# Patient Record
Sex: Female | Born: 1960
Health system: Southern US, Community
[De-identification: ages and names within clinical notes are randomized; demographics above are authoritative.]

## PROBLEM LIST (undated history)

## (undated) DIAGNOSIS — E039 Hypothyroidism, unspecified: Secondary | ICD-10-CM

## (undated) DIAGNOSIS — Z9889 Other specified postprocedural states: Secondary | ICD-10-CM

## (undated) DIAGNOSIS — Z972 Presence of dental prosthetic device (complete) (partial): Secondary | ICD-10-CM

## (undated) DIAGNOSIS — I10 Essential (primary) hypertension: Secondary | ICD-10-CM

## (undated) DIAGNOSIS — M1990 Unspecified osteoarthritis, unspecified site: Secondary | ICD-10-CM

## (undated) DIAGNOSIS — R7303 Prediabetes: Secondary | ICD-10-CM

## (undated) DIAGNOSIS — K219 Gastro-esophageal reflux disease without esophagitis: Secondary | ICD-10-CM

## (undated) HISTORY — PX: CERVICAL CONE BIOPSY: SUR198

## (undated) HISTORY — PX: ABDOMINAL HYSTERECTOMY: SHX81

---

## 2012-03-04 LAB — HIV ANTIBODY (ROUTINE TESTING W REFLEX): HIV 1&2 Ab, 4th Generation: NEGATIVE

## 2012-03-04 LAB — HM HEPATITIS C SCREENING LAB: HM Hepatitis Screen: NEGATIVE

## 2013-06-09 ENCOUNTER — Emergency Department: Payer: Self-pay | Admitting: Emergency Medicine
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
## 2016-04-14 ENCOUNTER — Ambulatory Visit: Payer: Self-pay | Admitting: Allergy and Immunology

## 2016-04-15 ENCOUNTER — Telehealth: Payer: Self-pay | Admitting: *Deleted

## 2016-04-15 NOTE — Telephone Encounter (Signed)
Called pt.  No answer.  Left VM to call office to give update.  Pt did not show for appt with Dr. Nunzio CobbsBobbitt on 04/14/16 at 10:30 after being worked in that morning for appointment.   Pt was contacted on 04/14/16 by Truitt MerlePatsy Murray and VM message left to call office.

## 2019-05-24 ENCOUNTER — Other Ambulatory Visit: Payer: Self-pay | Admitting: Family Medicine

## 2019-05-24 DIAGNOSIS — Z1231 Encounter for screening mammogram for malignant neoplasm of breast: Secondary | ICD-10-CM

## 2019-06-22 ENCOUNTER — Other Ambulatory Visit: Payer: Self-pay | Admitting: Family Medicine

## 2019-06-22 DIAGNOSIS — H8110 Benign paroxysmal vertigo, unspecified ear: Secondary | ICD-10-CM

## 2019-06-30 ENCOUNTER — Other Ambulatory Visit: Payer: Self-pay

## 2019-06-30 ENCOUNTER — Ambulatory Visit
Admission: RE | Admit: 2019-06-30 | Discharge: 2019-06-30 | Disposition: A | Discharge status: Home / Self Care | Payer: 59 | Source: Ambulatory Visit | Attending: Family Medicine | Admitting: Family Medicine

## 2019-06-30 DIAGNOSIS — Z1231 Encounter for screening mammogram for malignant neoplasm of breast: Secondary | ICD-10-CM

## 2019-06-30 IMAGING — MG DIGITAL SCREENING BILATERAL MAMMOGRAM WITH TOMO AND CAD
8 series · 9 of 24 positions shown · non-contrast
Comparison: None.

CLINICAL DATA: Screening.

EXAM:
DIGITAL SCREENING BILATERAL MAMMOGRAM WITH TOMO AND CAD

[L CC synth-2D]
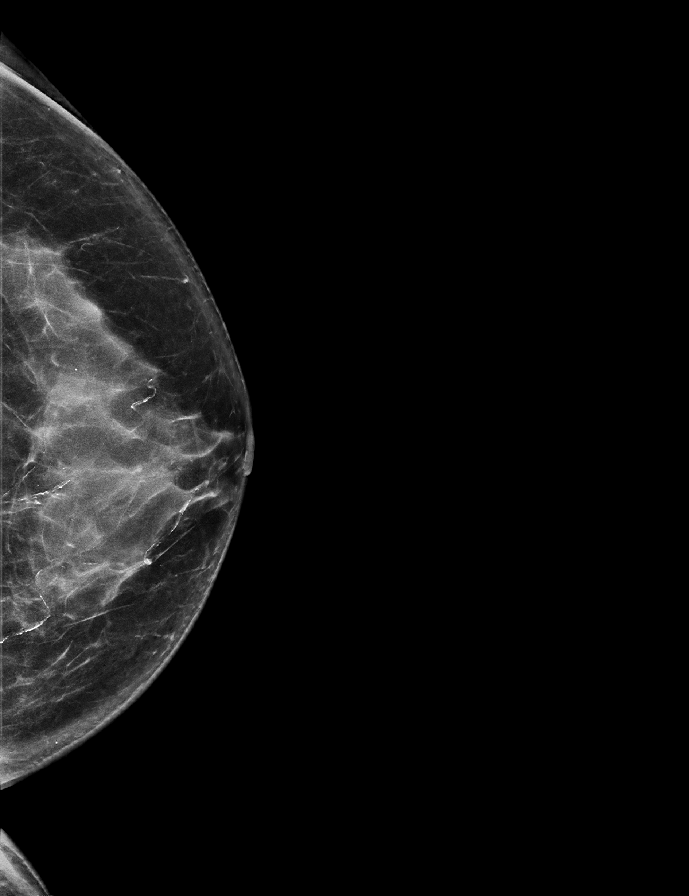

[R MLO synth-2D]
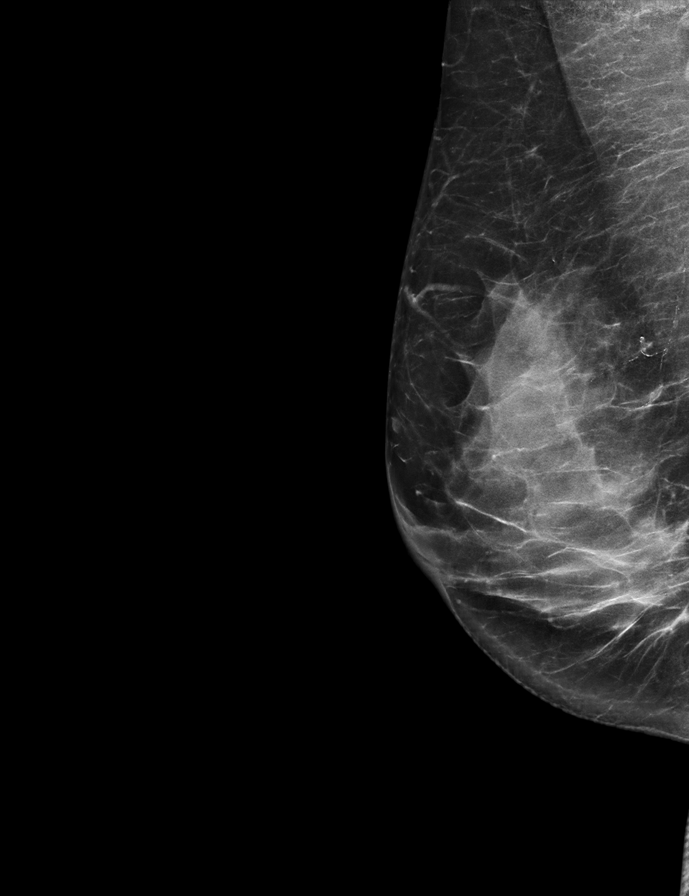

[L MLO synth-2D]
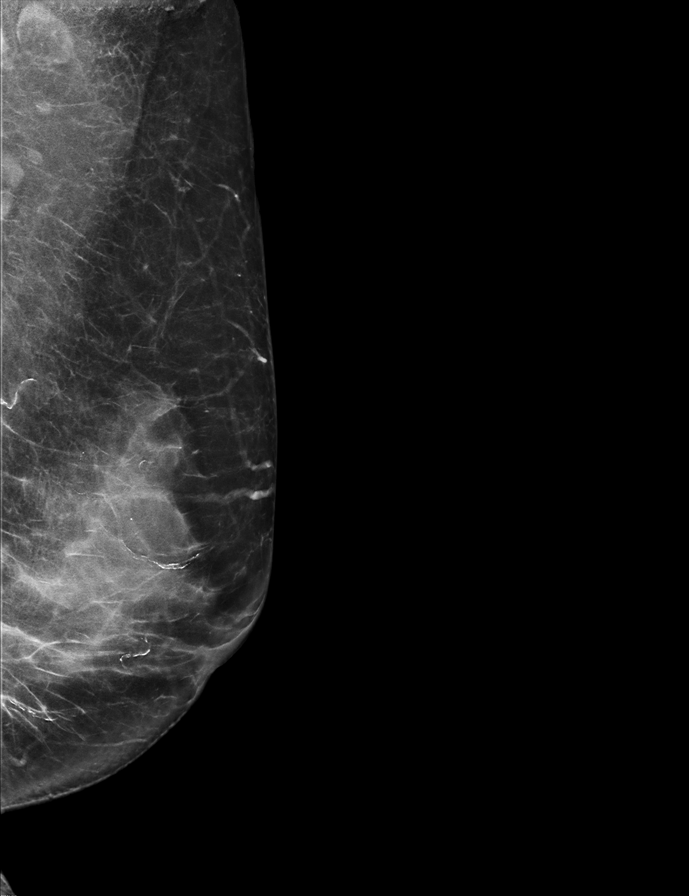

[R CC synth-2D]
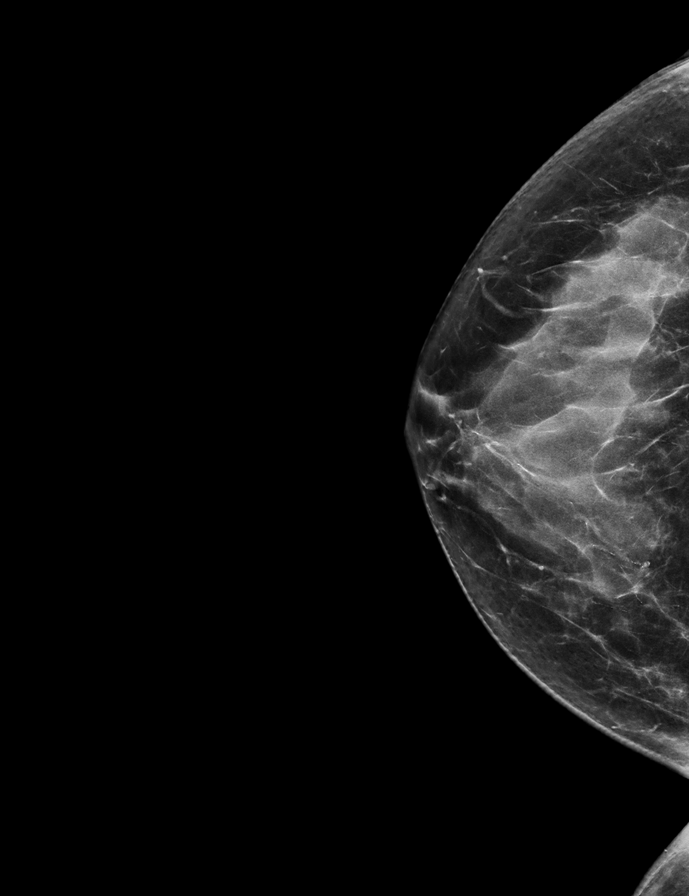

[L MLO tomo · 2 of 73 frames shown]
[frame 24/73]
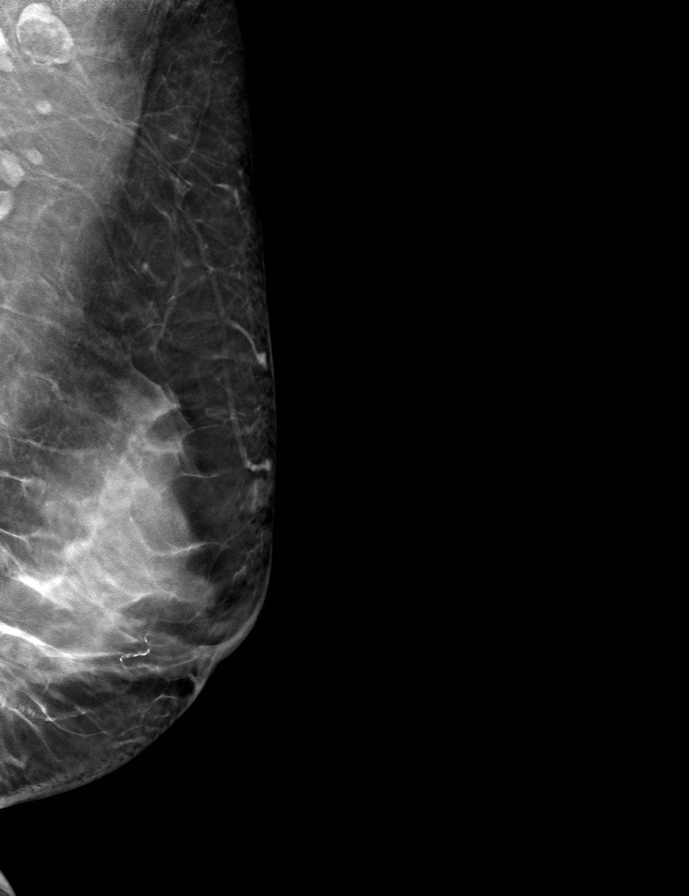
[frame 37/73]
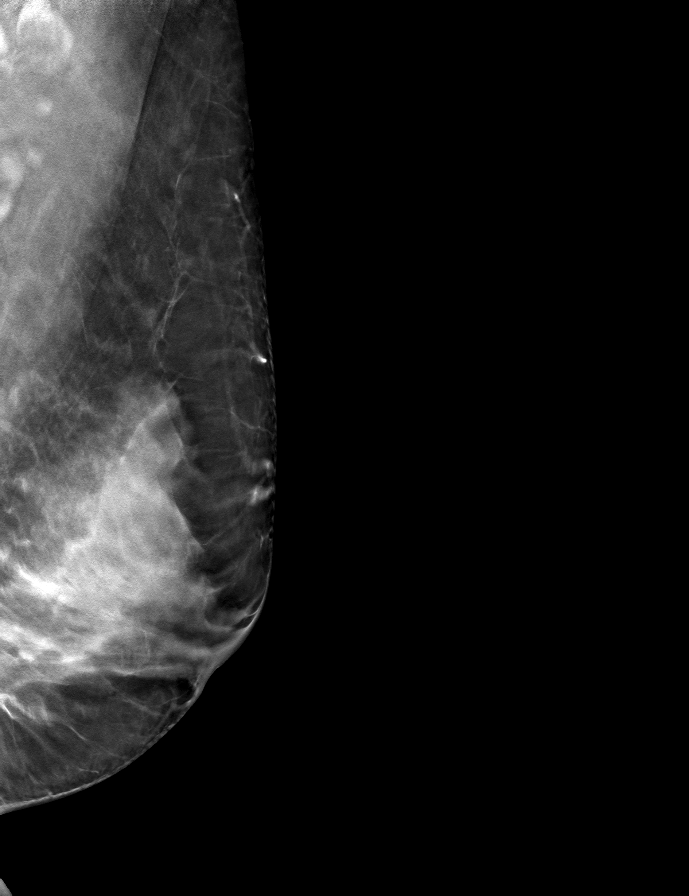

[R MLO tomo · tomo slice 37/72.0]
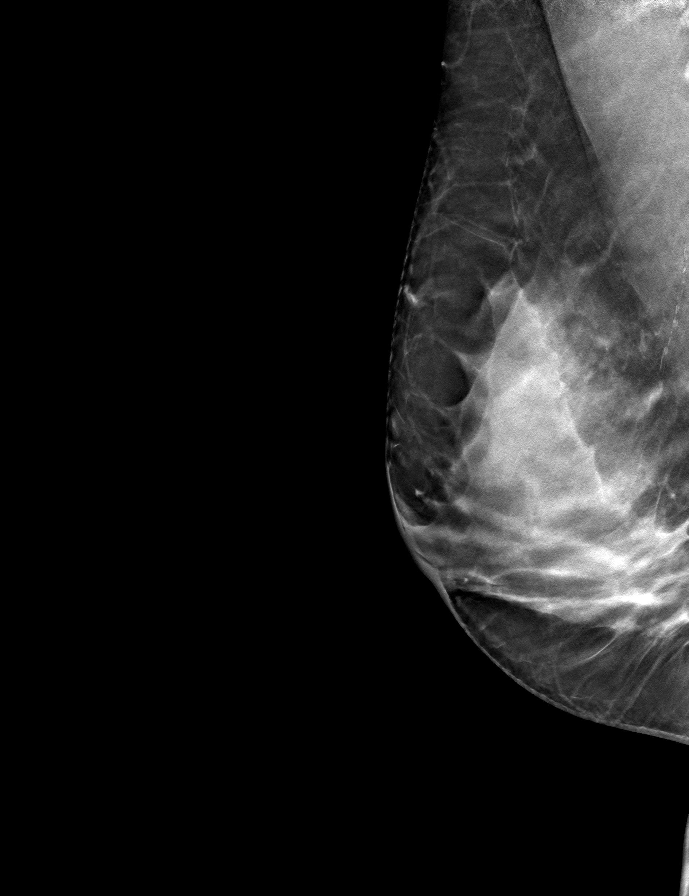

[L CC tomo · tomo slice 35/70.0]
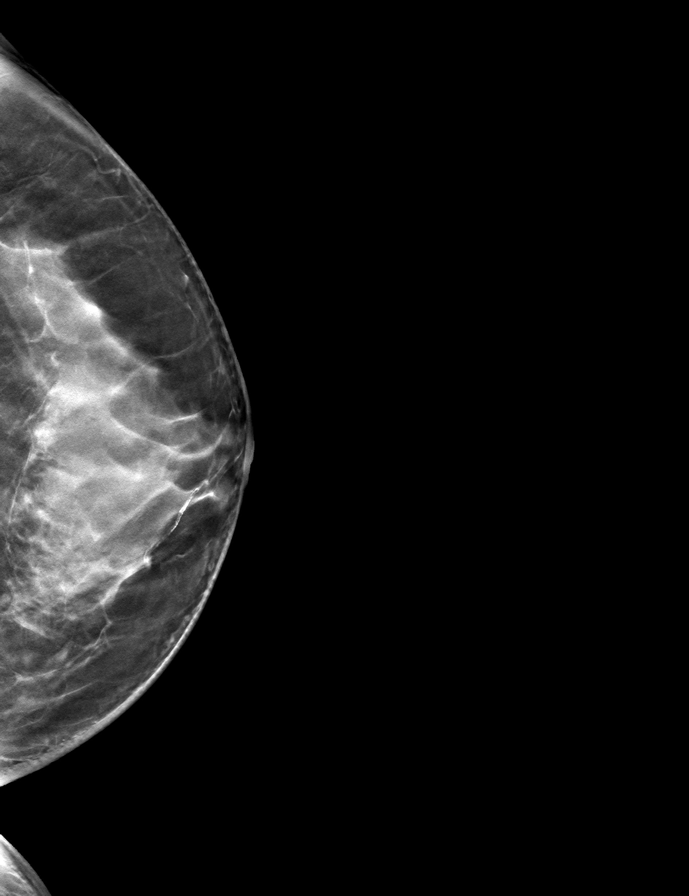

[R CC tomo · tomo slice 35/68.0]
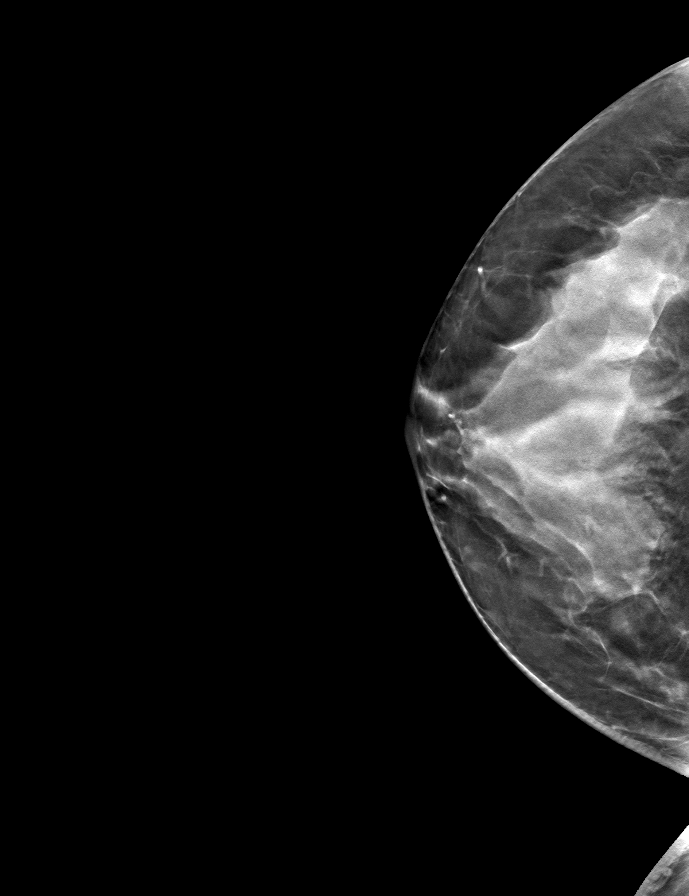

[9 of 24 positions shown; findings below may reference images not displayed]

ACR Breast Density Category c: The breast tissue is heterogeneously
dense, which may obscure small masses
FINDINGS: There are no findings suspicious for malignancy. Images were
processed with CAD.
IMPRESSION: No mammographic evidence of malignancy. A result letter of this
screening mammogram will be mailed directly to the patient.

RECOMMENDATION:
Screening mammogram in one year. (Code:[4W])

BI-RADS CATEGORY  1: Negative.

## 2019-07-07 ENCOUNTER — Ambulatory Visit: Payer: Self-pay

## 2019-08-03 ENCOUNTER — Other Ambulatory Visit: Payer: Self-pay | Admitting: Internal Medicine

## 2019-08-03 DIAGNOSIS — Z20822 Contact with and (suspected) exposure to covid-19: Secondary | ICD-10-CM

## 2019-08-04 ENCOUNTER — Ambulatory Visit: Payer: 59

## 2019-08-04 LAB — NOVEL CORONAVIRUS, NAA: SARS-CoV-2, NAA: NOT DETECTED

## 2019-08-13 ENCOUNTER — Ambulatory Visit
Admission: RE | Admit: 2019-08-13 | Discharge: 2019-08-13 | Disposition: A | Discharge status: Home / Self Care | Payer: 59 | Source: Ambulatory Visit | Attending: Family Medicine | Admitting: Family Medicine

## 2019-08-13 DIAGNOSIS — H8110 Benign paroxysmal vertigo, unspecified ear: Secondary | ICD-10-CM | POA: Diagnosis present

## 2019-08-13 IMAGING — MR MR HEAD W/O CM
12 series · 46 of 48 positions shown · non-contrast
Comparison: None.

CLINICAL DATA: Benign paroxysmal positional vertigo. Headaches
radiating into the neck since childhood.

EXAM:
MRI HEAD WITHOUT CONTRAST
TECHNIQUE: Multiplanar, multiecho pulse sequences of the brain and surrounding
structures were obtained without intravenous contrast.

[Series 5: ax dwi_tracew · axial · 3.0mm · 0.60mm/px · z∈[-107,+42]mm · 3 of 46 slices shown]
[im 1/46]
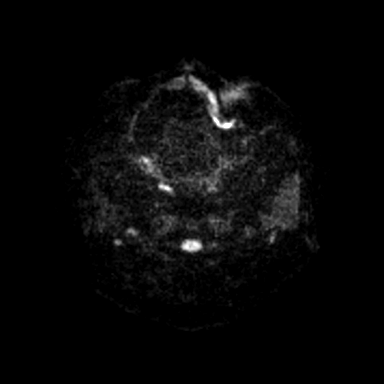
[im 23/46]
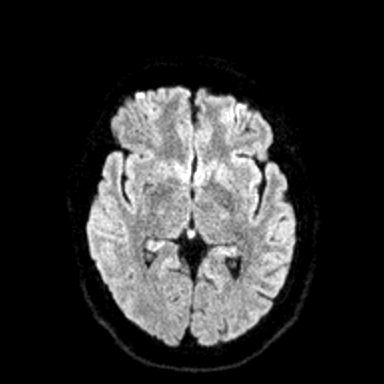
[im 46/46]
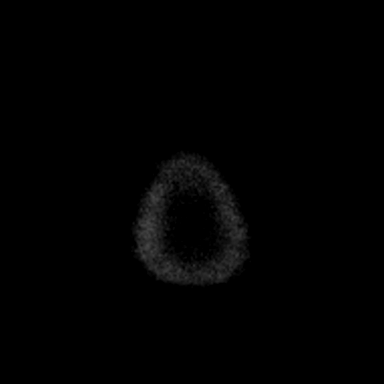

[Series 6: ax dwi_adc · axial · 3.0mm · 0.60mm/px · z∈[-107,+42]mm · 3 of 46 slices shown]
[im 1/46]
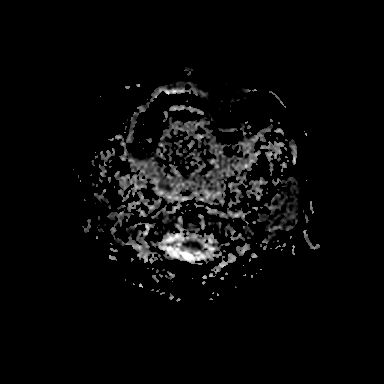
[im 23/46]
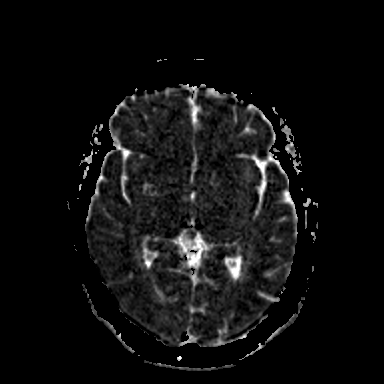
[im 46/46]
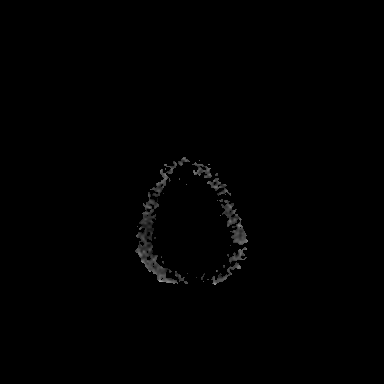

[Series 7: cor dwi_tracew · coronal · 5.0mm · 0.60mm/px · 3 of 34 slices shown]
[im 1/34]
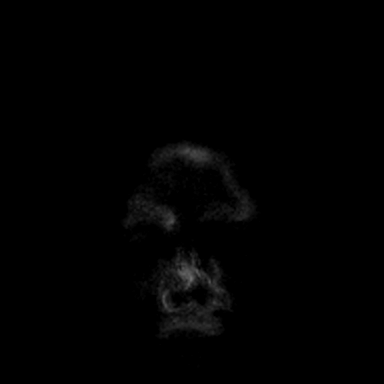
[im 17/34]
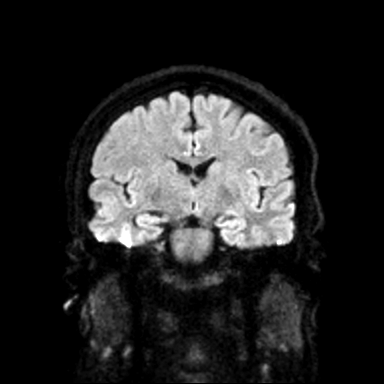
[im 34/34]
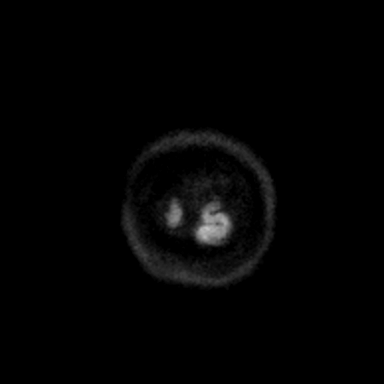

[Series 8: cor dwi_adc · coronal · 5.0mm · 0.60mm/px · 3 of 34 slices shown]
[im 1/34]
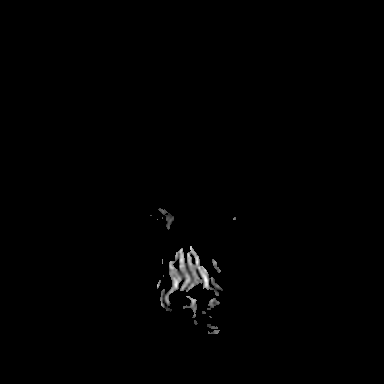
[im 17/34]
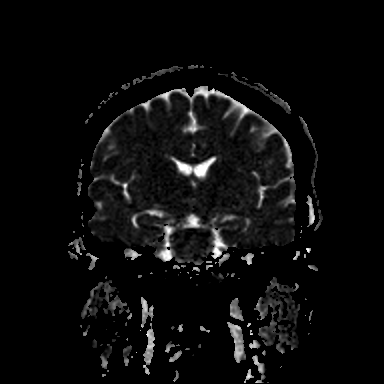
[im 34/34]
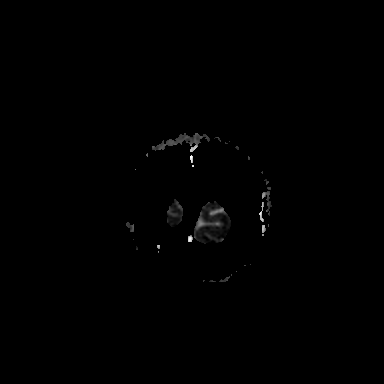

[Series 9: T1 · sagittal · 5.0mm · 0.62mm/px · 2 of 21 slices shown (1 of 2)]
[im 1/21]
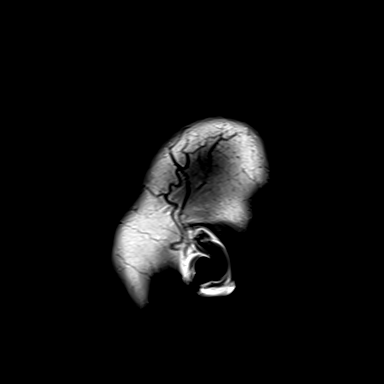
[im 21/21]
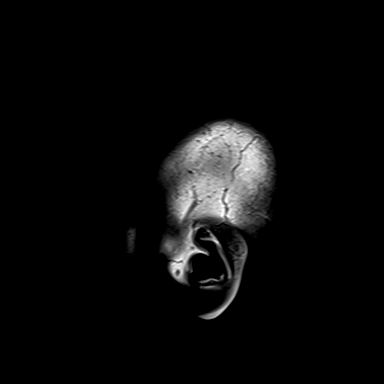

[Series 10: T2 · axial · 5.0mm · 0.53mm/px · z∈[-101,+42]mm · 2 of 25 slices shown (1 of 2)]
[im 1/25]
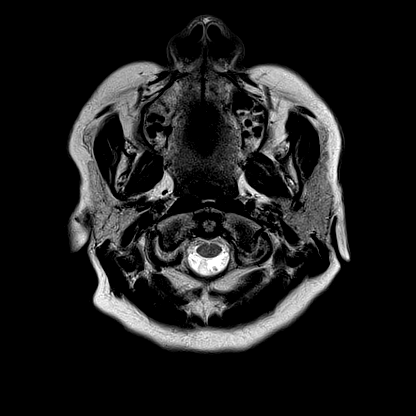
[im 25/25]
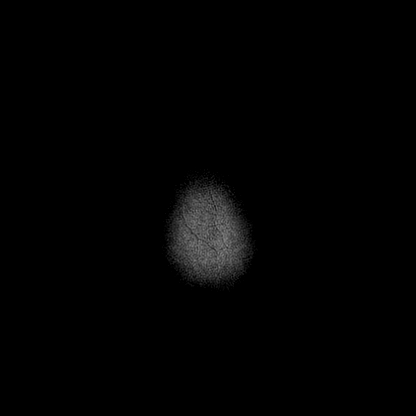

[Series 11: mag_images · axial · 3.0mm · 0.90mm/px · z∈[-118,+59]mm · 5 of 60 slices shown]
[im 1/60]
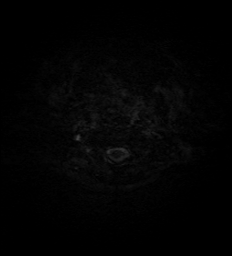
[im 15/60]
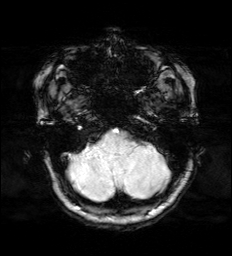
[im 30/60]
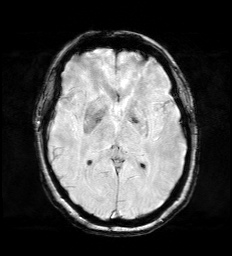
[im 45/60]
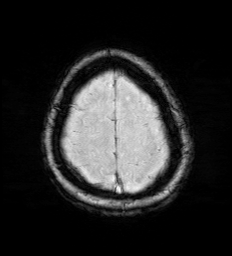
[im 60/60]
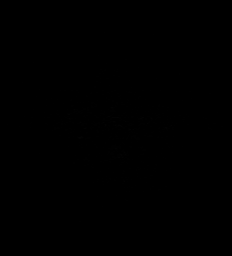

[Series 12: pha_images · axial · 3.0mm · 0.90mm/px · z∈[-118,+56]mm · 5 of 58 slices shown]
[im 1/58]
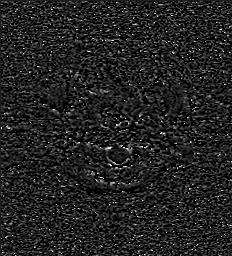
[im 15/58]
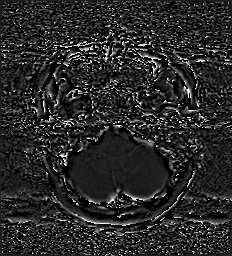
[im 29/58]
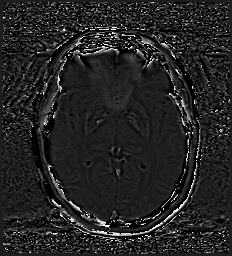
[im 43/58]
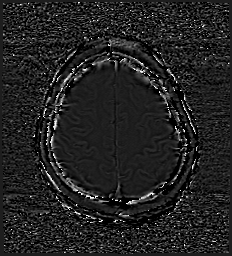
[im 58/58]
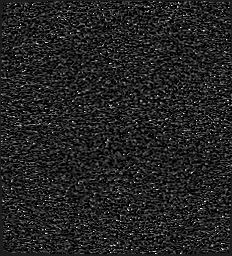

[Series 13: swi_images · axial · 3.0mm · 0.90mm/px · z∈[-118,+59]mm · 5 of 60 slices shown]
[im 1/60]
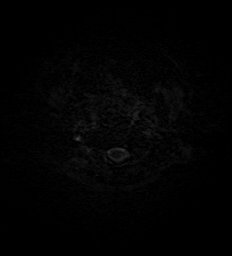
[im 15/60]
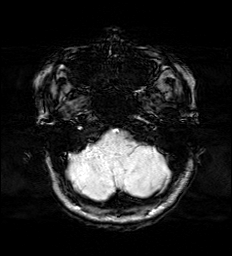
[im 30/60]
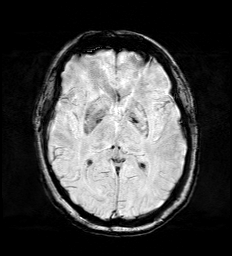
[im 45/60]
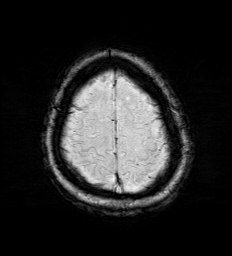
[im 60/60]
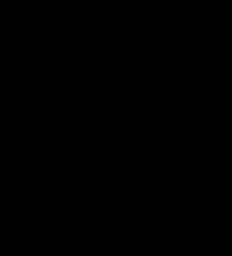

[Series 15: FLAIR · axial · 3.0mm · 0.53mm/px · z∈[-110,+51]mm · 4 of 55 slices shown]
[im 1/55]
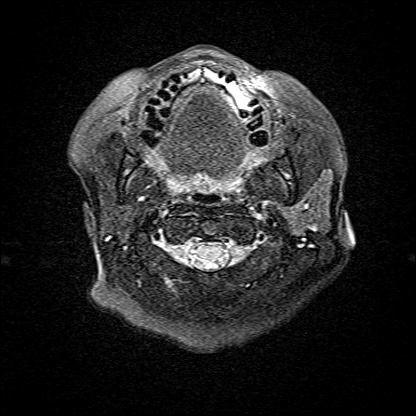
[im 19/55]
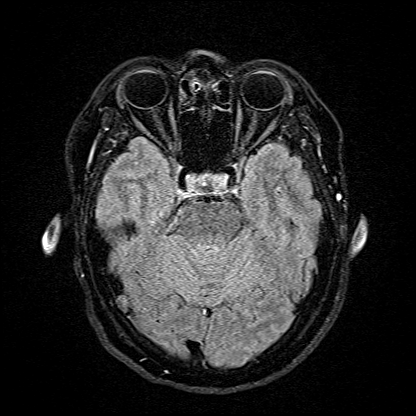
[im 37/55]
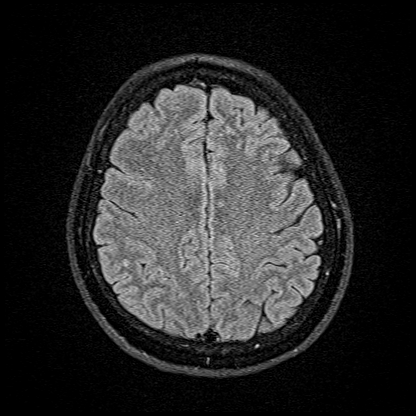
[im 55/55]
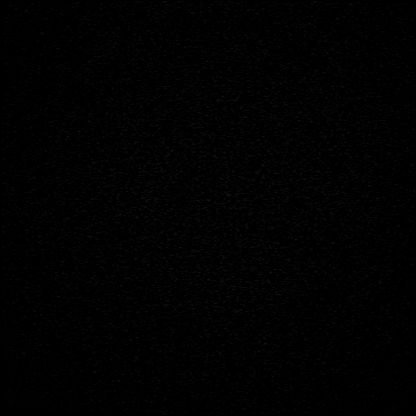

[Series 16: T1 · axial · 1.0mm · 0.98mm/px · z∈[-101,+42]mm · 9 of 144 slices shown (2 of 2)]
[im 1/144]
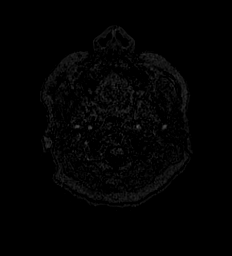
[im 15/144]
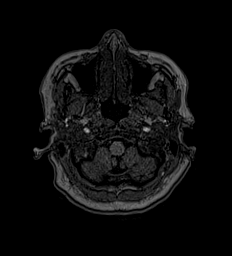
[im 29/144]
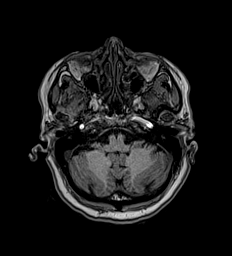
[im 43/144]
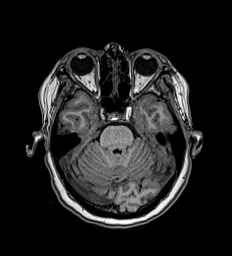
[im 58/144]
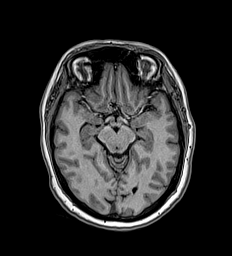
[im 86/144]
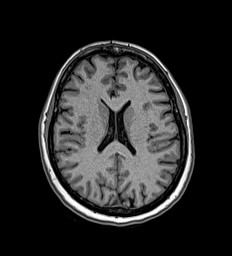
[im 101/144]
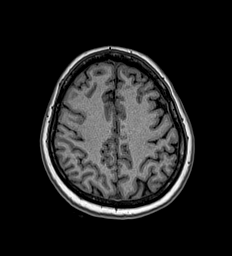
[im 115/144]
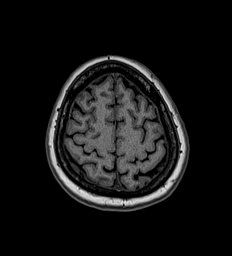
[im 144/144]
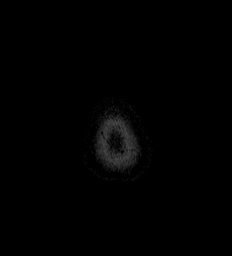

[Series 17: T2 · coronal · 5.0mm · 0.57mm/px · 2 of 29 slices shown (2 of 2)]
[im 1/29]
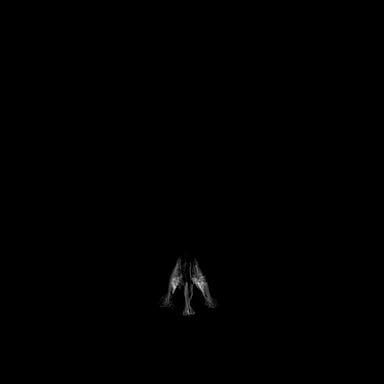
[im 29/29]
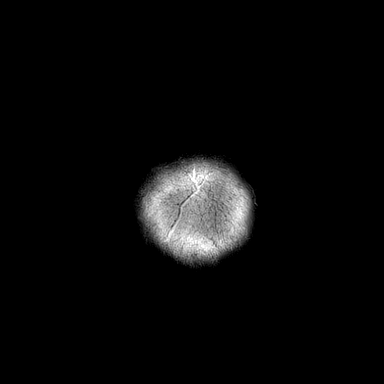

[46 of 48 positions shown; findings below may reference images not displayed]

FINDINGS: Brain: No infarction, hemorrhage, hydrocephalus, extra-axial
collection or mass lesion. No Chiari malformation. Normal brain
volume. Few small FLAIR hyperintensities in the cerebral white
matter (less than 20) with nonspecific pattern. No typical multiple
sclerosis pattern of infratentorial and periventricular involvement.
These could be related to prior ischemia, inflammation, complicated
migraine, or other vasculopathy.

Vascular: Normal flow voids.

Skull and upper cervical spine: Normal marrow signal

Sinuses/Orbits: Negative
IMPRESSION: 1. No abnormality noted in the brainstem, cisterns, or temporal
bones.
2. Mild remote white matter insult which is nonspecific.

## 2019-11-18 ENCOUNTER — Other Ambulatory Visit: Payer: Self-pay

## 2019-11-18 DIAGNOSIS — Z20822 Contact with and (suspected) exposure to covid-19: Secondary | ICD-10-CM

## 2019-11-20 LAB — NOVEL CORONAVIRUS, NAA: SARS-CoV-2, NAA: NOT DETECTED

## 2021-05-21 ENCOUNTER — Other Ambulatory Visit: Payer: Self-pay | Admitting: Family Medicine

## 2021-05-21 DIAGNOSIS — M5412 Radiculopathy, cervical region: Secondary | ICD-10-CM

## 2021-06-03 ENCOUNTER — Ambulatory Visit
Admission: RE | Admit: 2021-06-03 | Discharge: 2021-06-03 | Disposition: A | Discharge status: Home / Self Care | Payer: 59 | Source: Ambulatory Visit | Attending: Family Medicine | Admitting: Family Medicine

## 2021-06-03 ENCOUNTER — Other Ambulatory Visit: Payer: Self-pay

## 2021-06-03 DIAGNOSIS — M5412 Radiculopathy, cervical region: Secondary | ICD-10-CM

## 2021-06-03 IMAGING — MR MR CERVICAL SPINE W/O CM
5 series · 35 of 48 positions shown · non-contrast
Comparison: CT of the cervical spine [DATE]

CLINICAL DATA: Cervical radiculitis. Additional history provided by
scanning technologist: Patient reports 8 months of posterior neck
pain that radiates into both shoulders. Numbness.

EXAM:
MRI CERVICAL SPINE WITHOUT CONTRAST
TECHNIQUE: Multiplanar, multisequence MR imaging of the cervical spine was
performed. No intravenous contrast was administered.

[Series 2: T2 · sagittal · 3.0mm · 0.41mm/px · 8 of 15 slices shown (1 of 2)]
[im 1/15]
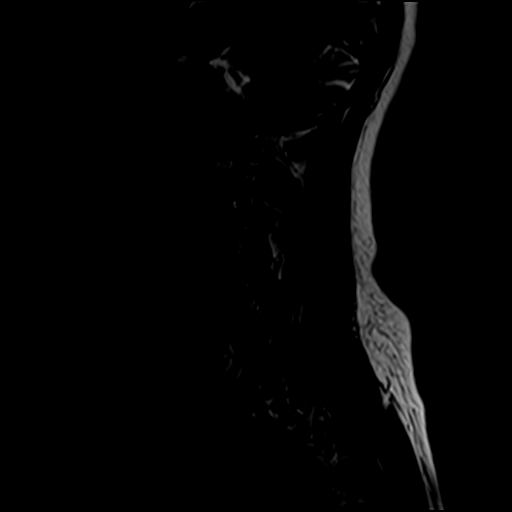
[im 3/15]
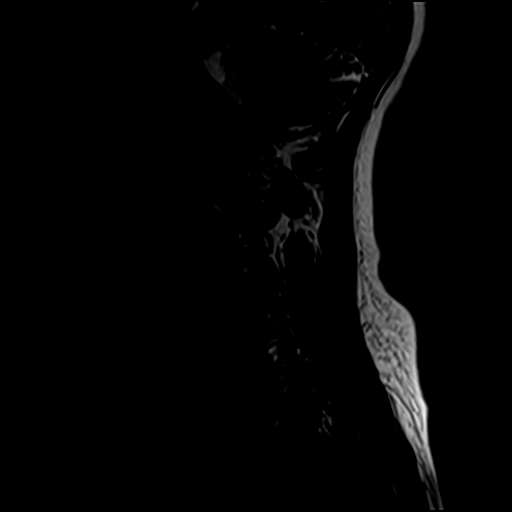
[im 5/15]
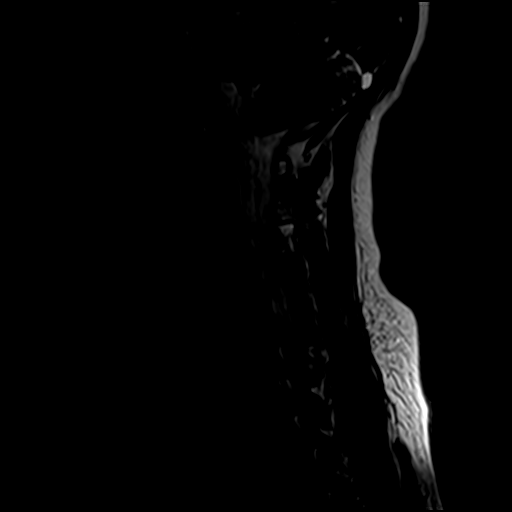
[im 7/15]
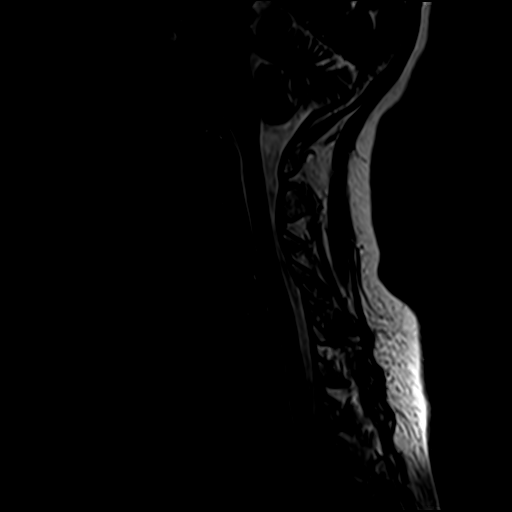
[im 9/15]
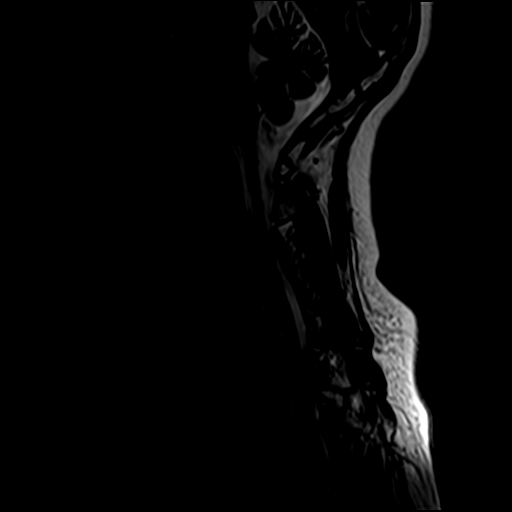
[im 11/15]
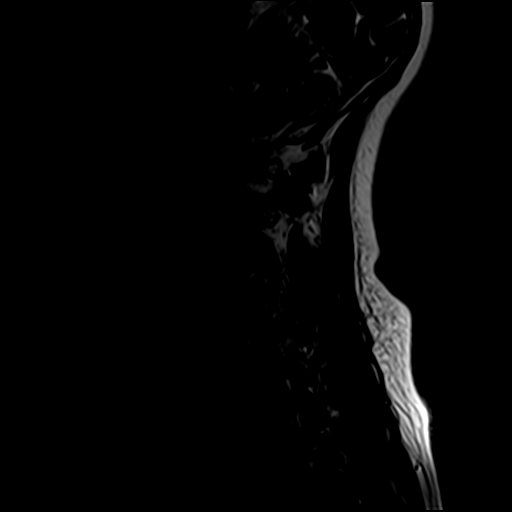
[im 13/15]
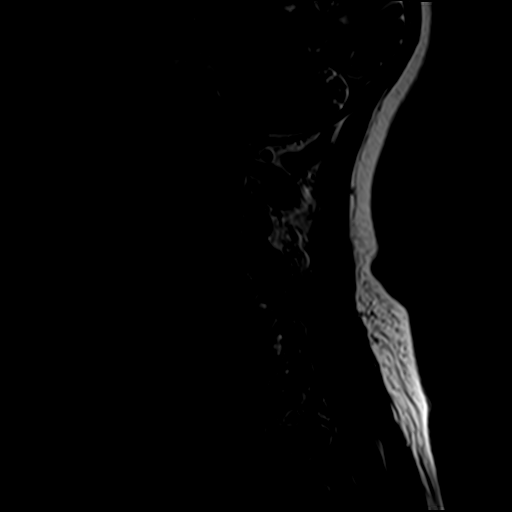
[im 15/15]
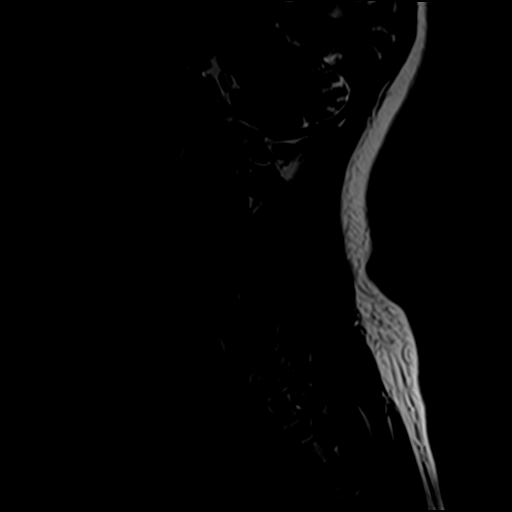

[Series 3: STIR · sagittal · 3.0mm · 0.82mm/px · 8 of 15 slices shown]
[im 1/15]
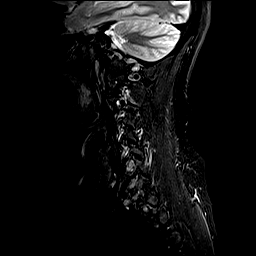
[im 3/15]
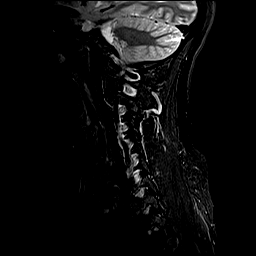
[im 5/15]
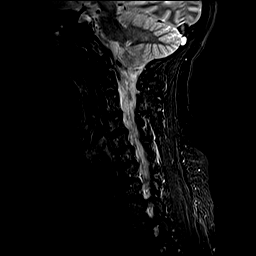
[im 7/15]
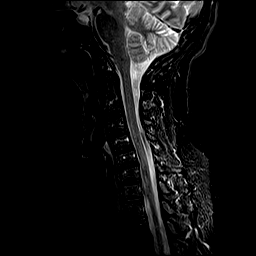
[im 9/15]
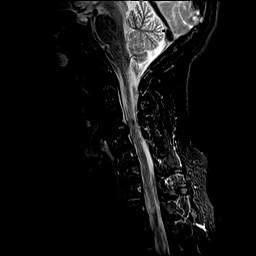
[im 11/15]
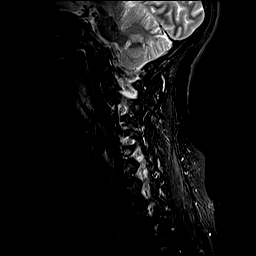
[im 13/15]
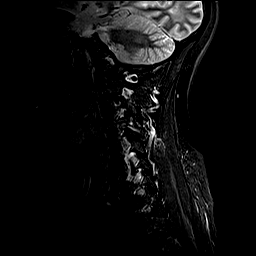
[im 15/15]
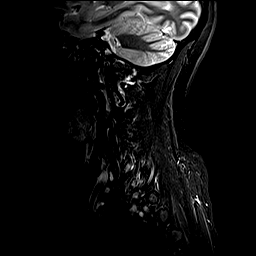

[Series 4: T1 · sagittal · 3.0mm · 0.82mm/px · 8 of 15 slices shown]
[im 1/15]
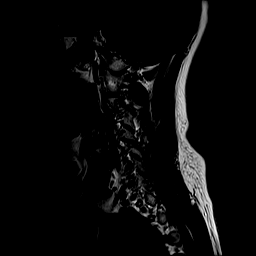
[im 3/15]
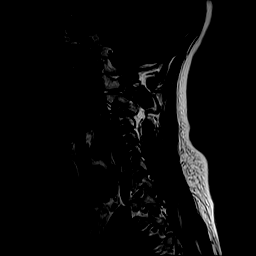
[im 5/15]
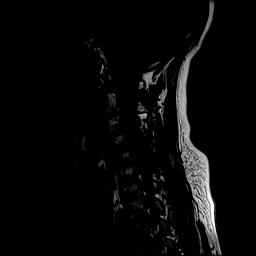
[im 7/15]
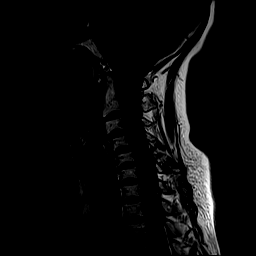
[im 9/15]
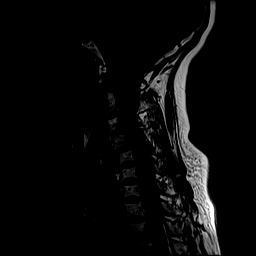
[im 11/15]
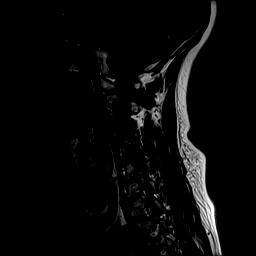
[im 13/15]
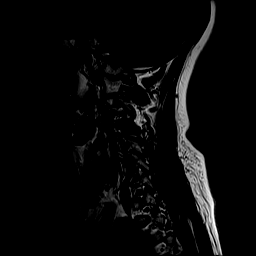
[im 15/15]
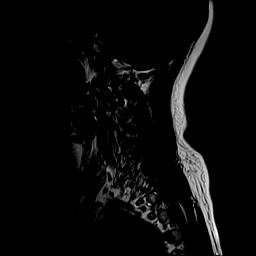

[Series 5: T2 · axial · 3.0mm · 0.70mm/px · z∈[-76,+3]mm · 9 of 23 slices shown (2 of 2)]
[im 1/23]
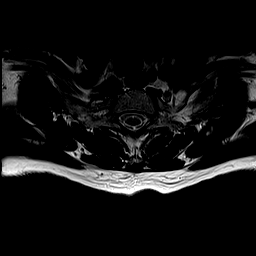
[im 5/23]
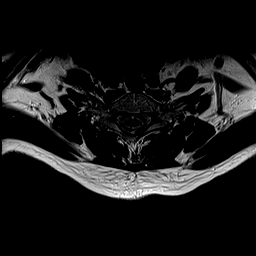
[im 7/23]
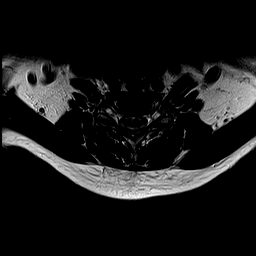
[im 11/23]
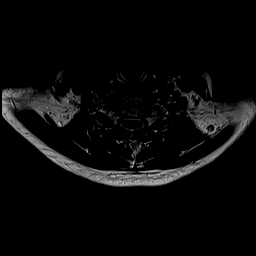
[im 13/23]
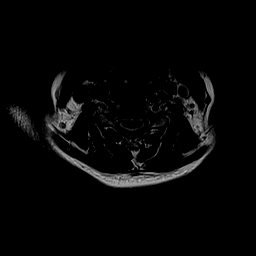
[im 17/23]
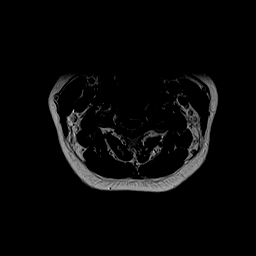
[im 19/23]
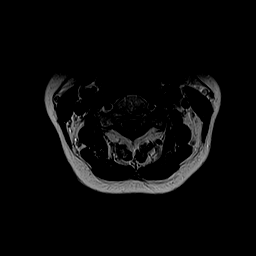
[im 21/23]
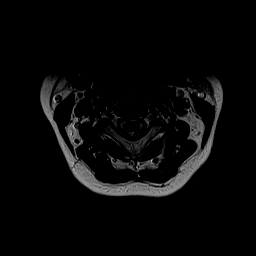
[im 23/23]
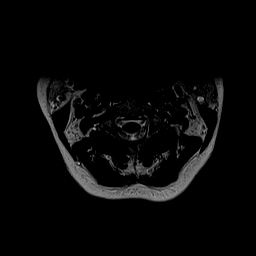

[Series 6: GRE · axial · 3.0mm · 0.35mm/px · z∈[-76,-62]mm · 2 of 23 slices shown]
[im 1/23]
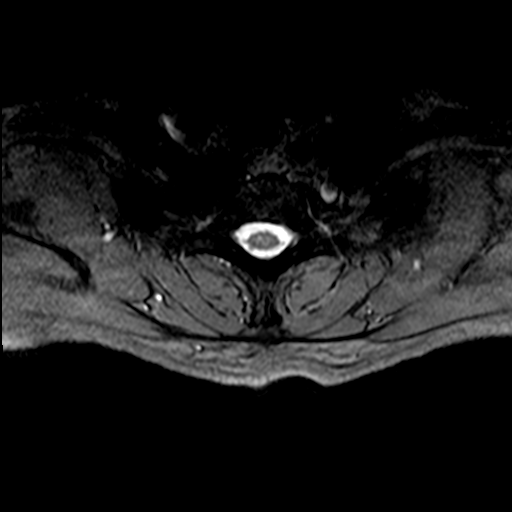
[im 5/23]
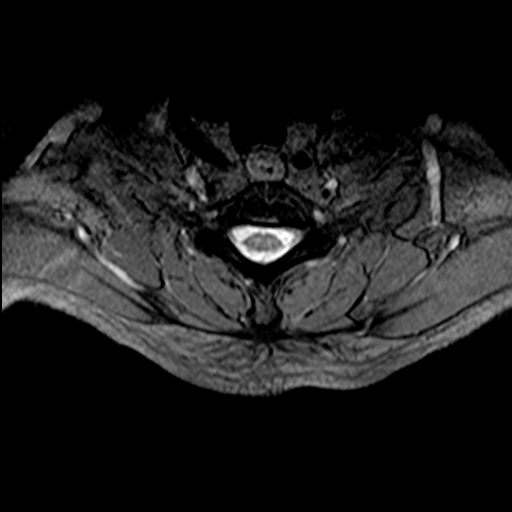

[35 of 48 positions shown; findings below may reference images not displayed]

FINDINGS: Alignment: Reversal of the expected cervical lordosis. Trace C2-C3
grade 1 retrolisthesis. 2 mm C4-C5 grade 1 anterolisthesis.

Vertebrae: Vertebral body height is maintained. Edema within the
right C3 and C4 articular pillars, likely degenerative and related
to facet arthrosis. Elsewhere, no significant marrow edema or focal
suspicious osseous lesion is identified.

Cord: No spinal cord signal abnormality is identified.

Posterior Fossa, vertebral arteries, paraspinal tissues: No
abnormality identified within included portions of the posterior
fossa. Flow voids preserved within the imaged cervical vertebral
arteries. Paraspinal soft tissues within normal limits.

Disc levels:

No more than mild disc degeneration within the cervical spine.

C2-C3: Minimal uncinate hypertrophy on the right. No significant
disc herniation, spinal canal stenosis or neural foraminal
narrowing.

C3-C4: Trace grade 1 anterolisthesis. Disc uncovering with small
disc bulge. Uncovertebral hypertrophy on the left. Facet arthrosis
on the left (advanced). No significant spinal canal stenosis.
Moderate left neural foraminal narrowing.

C4-C5: 2 mm grade 1 anterolisthesis. Disc uncovering. Mild facet
arthrosis (predominantly on the left). No significant spinal canal
stenosis. Minimal relative left neural foraminal narrowing.

C5-C6: No significant disc herniation or stenosis.

C6-C7: No significant disc herniation or stenosis.

C7-T1: No significant disc herniation or stenosis.
IMPRESSION: Cervical spondylosis, as outlined. No significant spinal canal
stenosis. At C3-C4, advanced facet arthrosis contributes to moderate
left neural foraminal narrowing. Minimal relative left neural
foraminal narrowing also present at C4-C5.

Marrow edema within the left C3 and C4 articular pillars, likely
degenerative and related to facet arthrosis at this site.

Reversal of the expected cervical lordosis.

Mild C3-C4 and C4-C5 grade 1 anterolisthesis.

## 2021-06-07 ENCOUNTER — Other Ambulatory Visit: Payer: Self-pay | Admitting: Family Medicine

## 2021-06-07 DIAGNOSIS — Z1231 Encounter for screening mammogram for malignant neoplasm of breast: Secondary | ICD-10-CM

## 2021-08-07 ENCOUNTER — Other Ambulatory Visit: Payer: Self-pay

## 2021-08-07 ENCOUNTER — Ambulatory Visit
Admission: RE | Admit: 2021-08-07 | Discharge: 2021-08-07 | Disposition: A | Discharge status: Home / Self Care | Payer: 59 | Source: Ambulatory Visit | Attending: Family Medicine | Admitting: Family Medicine

## 2021-08-07 DIAGNOSIS — Z1231 Encounter for screening mammogram for malignant neoplasm of breast: Secondary | ICD-10-CM | POA: Diagnosis present

## 2022-05-30 ENCOUNTER — Emergency Department: Payer: 59

## 2022-05-30 ENCOUNTER — Encounter: Payer: Self-pay | Admitting: Emergency Medicine

## 2022-05-30 ENCOUNTER — Other Ambulatory Visit: Payer: Self-pay

## 2022-05-30 ENCOUNTER — Emergency Department
Admission: EM | Admit: 2022-05-30 | Discharge: 2022-05-30 | Disposition: A | Discharge status: Home / Self Care | Payer: 59 | Attending: Emergency Medicine | Admitting: Emergency Medicine

## 2022-05-30 DIAGNOSIS — I10 Essential (primary) hypertension: Secondary | ICD-10-CM | POA: Diagnosis not present

## 2022-05-30 DIAGNOSIS — R42 Dizziness and giddiness: Secondary | ICD-10-CM | POA: Diagnosis present

## 2022-05-30 HISTORY — DX: Essential (primary) hypertension: I10

## 2022-05-30 LAB — BASIC METABOLIC PANEL
Anion gap: 5 (ref 5–15)
BUN: 14 mg/dL (ref 6–20)
CO2: 28 mmol/L (ref 22–32)
Calcium: 9.4 mg/dL (ref 8.9–10.3)
Chloride: 107 mmol/L (ref 98–111)
Creatinine, Ser: 0.7 mg/dL (ref 0.44–1.00)
GFR, Estimated: >60 mL/min (ref >60)
Glucose, Bld: 143 mg/dL — ABNORMAL HIGH (ref 70–99)
Potassium: 3.5 mmol/L (ref 3.5–5.1)
Sodium: 140 mmol/L (ref 135–145)

## 2022-05-30 LAB — CBC
HCT: 39.5 % (ref 36.0–46.0)
Hemoglobin: 13.1 g/dL (ref 12.0–15.0)
MCH: 29.6 pg (ref 26.0–34.0)
MCHC: 33.2 g/dL (ref 30.0–36.0)
MCV: 89.4 fL (ref 80.0–100.0)
Platelets: 361 10*3/uL (ref 150–400)
RBC: 4.42 MIL/uL (ref 3.87–5.11)
RDW: 13.1 % (ref 11.5–15.5)
WBC: 8.6 10*3/uL (ref 4.0–10.5)
nRBC: 0 % (ref 0.0–0.2)

## 2022-05-30 LAB — TROPONIN I (HIGH SENSITIVITY)
Troponin I (High Sensitivity): 3 ng/L (ref <18)
Troponin I (High Sensitivity): 3 ng/L (ref <18)

## 2022-05-30 MED ORDER — AMLODIPINE BESYLATE 5 MG PO TABS: 5.0000 mg | ORAL_TABLET | Freq: Every day | ORAL | 1 refills | Status: DC

## 2022-10-15 ENCOUNTER — Encounter: Payer: Self-pay | Admitting: Ophthalmology

## 2022-10-21 NOTE — Discharge Instructions (Signed)

## 2022-10-22 ENCOUNTER — Ambulatory Visit: Payer: 59 | Admitting: Anesthesiology

## 2022-10-22 ENCOUNTER — Encounter: Payer: Self-pay | Admitting: Ophthalmology

## 2022-10-22 ENCOUNTER — Ambulatory Visit
Admission: RE | Admit: 2022-10-22 | Discharge: 2022-10-22 | Disposition: A | Discharge status: Home / Self Care | Payer: 59 | Attending: Ophthalmology | Admitting: Ophthalmology

## 2022-10-22 ENCOUNTER — Encounter
Admission: RE | Disposition: A | Discharge status: Home / Self Care | Payer: Self-pay | Source: Home / Self Care | Attending: Ophthalmology

## 2022-10-22 ENCOUNTER — Other Ambulatory Visit: Payer: Self-pay

## 2022-10-22 DIAGNOSIS — I10 Essential (primary) hypertension: Secondary | ICD-10-CM | POA: Diagnosis not present

## 2022-10-22 DIAGNOSIS — E039 Hypothyroidism, unspecified: Secondary | ICD-10-CM | POA: Insufficient documentation

## 2022-10-22 DIAGNOSIS — Z79899 Other long term (current) drug therapy: Secondary | ICD-10-CM | POA: Diagnosis not present

## 2022-10-22 DIAGNOSIS — Z79890 Hormone replacement therapy (postmenopausal): Secondary | ICD-10-CM | POA: Insufficient documentation

## 2022-10-22 DIAGNOSIS — H2511 Age-related nuclear cataract, right eye: Secondary | ICD-10-CM | POA: Insufficient documentation

## 2022-10-22 DIAGNOSIS — R7303 Prediabetes: Secondary | ICD-10-CM | POA: Insufficient documentation

## 2022-10-22 HISTORY — DX: Gastro-esophageal reflux disease without esophagitis: K21.9

## 2022-10-22 HISTORY — DX: Other specified postprocedural states: Z98.890

## 2022-10-22 HISTORY — DX: Unspecified osteoarthritis, unspecified site: M19.90

## 2022-10-22 HISTORY — DX: Hypothyroidism, unspecified: E03.9

## 2022-10-22 HISTORY — PX: CATARACT EXTRACTION W/PHACO: SHX586

## 2022-10-22 HISTORY — DX: Presence of dental prosthetic device (complete) (partial): Z97.2

## 2022-10-22 HISTORY — DX: Prediabetes: R73.03

## 2022-10-22 SURGERY — PHACOEMULSIFICATION, CATARACT, WITH IOL INSERTION
Anesthesia: Monitor Anesthesia Care | Site: Eye | Laterality: Right

## 2022-10-22 MED ORDER — SIGHTPATH DOSE#1 NA HYALUR & NA CHOND-NA HYALUR IO KIT
PACK | INTRAOCULAR | Status: DC | PRN
  Administered 2022-10-22: 1 via OPHTHALMIC

## 2022-10-22 MED ORDER — SIGHTPATH DOSE#1 BSS IO SOLN
INTRAOCULAR | Status: DC | PRN
  Administered 2022-10-22: 54 mL via OPHTHALMIC

## 2022-10-22 MED ORDER — MIDAZOLAM HCL 2 MG/2ML IJ SOLN
INTRAMUSCULAR | Status: DC | PRN
  Administered 2022-10-22: 2 mg via INTRAVENOUS

## 2022-10-22 MED ORDER — SIGHTPATH DOSE#1 BSS IO SOLN
INTRAOCULAR | Status: DC | PRN
  Administered 2022-10-22: 1 mL via INTRAMUSCULAR

## 2022-10-22 MED ORDER — FENTANYL CITRATE (PF) 100 MCG/2ML IJ SOLN
INTRAMUSCULAR | Status: DC | PRN
  Administered 2022-10-22: 100 ug via INTRAVENOUS

## 2022-10-22 MED ORDER — SIGHTPATH DOSE#1 BSS IO SOLN
INTRAOCULAR | Status: DC | PRN
  Administered 2022-10-22: 15 mL

## 2022-10-22 MED ORDER — ARMC OPHTHALMIC DILATING DROPS
1.0000 | OPHTHALMIC | Status: DC | PRN
  Administered 2022-10-22 (×3): 1 via OPHTHALMIC

## 2022-10-22 MED ORDER — TETRACAINE HCL 0.5 % OP SOLN
1.0000 [drp] | OPHTHALMIC | Status: DC | PRN
  Administered 2022-10-22 (×3): 1 [drp] via OPHTHALMIC

## 2022-10-22 MED ORDER — CEFUROXIME OPHTHALMIC INJECTION 1 MG/0.1 ML
INJECTION | OPHTHALMIC | Status: DC | PRN
  Administered 2022-10-22: .1 mL via INTRACAMERAL

## 2022-10-22 MED ORDER — BRIMONIDINE TARTRATE-TIMOLOL 0.2-0.5 % OP SOLN
OPHTHALMIC | Status: DC | PRN
  Administered 2022-10-22: 1 [drp] via OPHTHALMIC

## 2022-10-22 SURGICAL SUPPLY — 10 items
CATARACT SUITE SIGHTPATH (MISCELLANEOUS) ×1 IMPLANT
FEE CATARACT SUITE SIGHTPATH (MISCELLANEOUS) ×1 IMPLANT
GLOVE SRG 8 PF TXTR STRL LF DI (GLOVE) ×1 IMPLANT
GLOVE SURG ENC TEXT LTX SZ7.5 (GLOVE) ×1 IMPLANT
GLOVE SURG UNDER POLY LF SZ8 (GLOVE) ×1
LENS IOL TECNIS EYHANCE 25.0 (Intraocular Lens) IMPLANT
NDL FILTER BLUNT 18X1 1/2 (NEEDLE) ×1 IMPLANT
NEEDLE FILTER BLUNT 18X1 1/2 (NEEDLE) ×1 IMPLANT
SYR 3ML LL SCALE MARK (SYRINGE) ×1 IMPLANT
WATER STERILE IRR 250ML POUR (IV SOLUTION) ×1 IMPLANT

## 2022-10-22 NOTE — Anesthesia Preprocedure Evaluation (Signed)
Anesthesia Evaluation  Patient identified by MRN, date of birth, ID band Patient awake    Reviewed: Allergy & Precautions, NPO status , Patient's Chart, lab work & pertinent test results  History of Anesthesia Complications (+) PONV and history of anesthetic complications  Airway Mallampati: II  TM Distance: >3 FB Neck ROM: full    Dental  (+) Partial Lower   Pulmonary neg pulmonary ROS   Pulmonary exam normal        Cardiovascular hypertension, On Medications negative cardio ROS Normal cardiovascular exam     Neuro/Psych negative neurological ROS  negative psych ROS   GI/Hepatic Neg liver ROS,GERD  ,,  Endo/Other  Hypothyroidism    Renal/GU      Musculoskeletal   Abdominal   Peds  Hematology negative hematology ROS (+)   Anesthesia Other Findings Past Medical History: No date: Arthritis     Comment:  shoulders No date: Dental bridge present     Comment:  permanent top, removable lower No date: GERD (gastroesophageal reflux disease) No date: Hypertension No date: Hypothyroidism No date: PONV (postoperative nausea and vomiting)     Comment:  after cone biopsy No date: Pre-diabetes  Past Surgical History: No date: ABDOMINAL HYSTERECTOMY No date: CERVICAL CONE BIOPSY  BMI    Body Mass Index: 24.51 kg/m      Reproductive/Obstetrics negative OB ROS                             Anesthesia Physical Anesthesia Plan  ASA: 2  Anesthesia Plan: MAC   Post-op Pain Management: Minimal or no pain anticipated   Induction: Intravenous  PONV Risk Score and Plan:   Airway Management Planned: Natural Airway and Nasal Cannula  Additional Equipment:   Intra-op Plan:   Post-operative Plan:   Informed Consent: I have reviewed the patients History and Physical, chart, labs and discussed the procedure including the risks, benefits and alternatives for the proposed anesthesia with  the patient or authorized representative who has indicated his/her understanding and acceptance.     Dental Advisory Given  Plan Discussed with: Anesthesiologist, CRNA and Surgeon  Anesthesia Plan Comments: (Patient consented for risks of anesthesia including but not limited to:  - adverse reactions to medications - damage to eyes, teeth, lips or other oral mucosa - nerve damage due to positioning  - sore throat or hoarseness - Damage to heart, brain, nerves, lungs, other parts of body or loss of life  Patient voiced understanding.)       Anesthesia Quick Evaluation

## 2022-10-22 NOTE — H&P (Signed)
  [facility]   Primary Care Physician:  Neita Goodnight Presley Raddle, MD Ophthalmologist: Dr. Lockie Mola  Pre-Procedure History & Physical: HPI:  Michelle Baxter is a 61 y.o. female here for ophthalmic surgery.   Past Medical History:  Diagnosis Date   Arthritis    shoulders   Dental bridge present    permanent top, removable lower   GERD (gastroesophageal reflux disease)    Hypertension    Hypothyroidism    PONV (postoperative nausea and vomiting)    after cone biopsy   Pre-diabetes     Past Surgical History:  Procedure Laterality Date   ABDOMINAL HYSTERECTOMY     CERVICAL CONE BIOPSY      Prior to Admission medications   Medication Sig Start Date End Date Taking? Authorizing Provider  amLODipine (NORVASC) 5 MG tablet Take 1 tablet (5 mg total) by mouth daily. 05/30/22 05/30/23 Yes Phineas Semen, MD  cetirizine (ZYRTEC) 10 MG tablet Take 10 mg by mouth daily.   Yes [provider]  levothyroxine (SYNTHROID) 50 MCG tablet Take 50 mcg by mouth daily before breakfast.   Yes [provider]  losartan (COZAAR) 100 MG tablet Take 100 mg by mouth daily.   Yes [provider]  omeprazole (PRILOSEC) 40 MG capsule Take 40 mg by mouth daily.   Yes [provider]    Allergies as of 07/17/2022   (No Known Allergies)    Family History  Problem Relation Age of Onset   Breast cancer Neg Hx     Social History   Socioeconomic History   Marital status: Married    Spouse name: Not on file   Number of children: Not on file   Years of education: Not on file   Highest education level: Not on file  Occupational History   Not on file  Tobacco Use   Smoking status: Never   Smokeless tobacco: Never  Vaping Use   Vaping Use: Never used  Substance and Sexual Activity   Alcohol use: Not Currently   Drug use: Not on file   Sexual activity: Not on file  Other Topics Concern   Not on file  Social History Narrative   Not on  file   Social Determinants of Health   Financial Resource Strain: Not on file  Food Insecurity: Not on file  Transportation Needs: Not on file  Physical Activity: Not on file  Stress: Not on file  Social Connections: Not on file  Intimate Partner Violence: Not on file    Review of Systems: See HPI, otherwise negative ROS  Physical Exam: BP 126/74   Pulse 67   Temp 98.7 F (37.1 C) (Temporal)   Resp 20   Ht 5\' 2"  (1.575 m)   Wt 60.8 kg   SpO2 100%   BMI 24.51 kg/m  General:   Alert,  pleasant and cooperative in NAD Head:  Normocephalic and atraumatic. Lungs:  Clear to auscultation.    Heart:  Regular rate and rhythm.   Impression/Plan: Michelle Baxter is here for ophthalmic surgery.  Risks, benefits, limitations, and alternatives regarding ophthalmic surgery have been reviewed with the patient.  Questions have been answered.  All parties agreeable.   Jacques Earthly, MD  10/22/2022, 7:55 AM

## 2022-10-22 NOTE — Op Note (Signed)
LOCATION:  [facility]   PREOPERATIVE DIAGNOSIS:    Nuclear sclerotic cataract right eye. H25.11   POSTOPERATIVE DIAGNOSIS:  Nuclear sclerotic cataract right eye.     PROCEDURE:  Phacoemusification with posterior chamber intraocular lens placement of the right eye   ULTRASOUND TIME: Procedure(s): CATARACT EXTRACTION PHACO AND INTRAOCULAR LENS PLACEMENT (IOC) RIGHT  10:34  01:01.4 (Right)  LENS:   Implant Name Type Inv. Item Serial No. Manufacturer Lot No. LRB No. Used Action  LENS IOL TECNIS EYHANCE 25.0 - [id number] Intraocular Lens LENS IOL TECNIS EYHANCE 25.0 [id number] [employer]  Right 1 Implanted         SURGEON:  Deirdre Evener, MD   ANESTHESIA:  Topical with tetracaine drops and 2% Xylocaine jelly, augmented with 1% preservative-free intracameral lidocaine.    COMPLICATIONS:  None.   DESCRIPTION OF PROCEDURE:  The patient was identified in the holding room and transported to the operating room and placed in the supine position under the operating microscope.  The right eye was identified as the operative eye and it was prepped and draped in the usual sterile ophthalmic fashion.   A 1 millimeter clear-corneal paracentesis was made at the 12:00 position.  0.5 ml of preservative-free 1% lidocaine was injected into the anterior chamber. The anterior chamber was filled with Viscoat viscoelastic.  A 2.4 millimeter keratome was used to make a near-clear corneal incision at the 9:00 position.  A curvilinear capsulorrhexis was made with a cystotome and capsulorrhexis forceps.  Balanced salt solution was used to hydrodissect and hydrodelineate the nucleus.   Phacoemulsification was then used in stop and chop fashion to remove the lens nucleus and epinucleus.  The remaining cortex was then removed using the irrigation and aspiration handpiece. Provisc was then placed into the capsular bag to distend it for lens placement.  A lens was then injected into the capsular bag.   The remaining viscoelastic was aspirated.   Wounds were hydrated with balanced salt solution.  The anterior chamber was inflated to a physiologic pressure with balanced salt solution.  No wound leaks were noted. Cefuroxime 0.1 ml of a 10mg /ml solution was injected into the anterior chamber for a dose of 1 mg of intracameral antibiotic at the completion of the case.   Timolol and Brimonidine drops were applied to the eye.  The patient was taken to the recovery room in stable condition without complications of anesthesia or surgery.   Ziyana Morikawa 10/22/2022, 8:26 AM

## 2022-10-22 NOTE — Transfer of Care (Signed)
Immediate Anesthesia Transfer of Care Note  Patient: Michelle Baxter  Procedure(s) Performed: CATARACT EXTRACTION PHACO AND INTRAOCULAR LENS PLACEMENT (IOC) RIGHT  10:34  01:01.4 (Right: Eye)  Patient Location: PACU  Anesthesia Type: MAC  Level of Consciousness: awake, alert  and patient cooperative  Airway and Oxygen Therapy: Patient Spontanous Breathing and Patient connected to supplemental oxygen  Post-op Assessment: Post-op Vital signs reviewed, Patient's Cardiovascular Status Stable, Respiratory Function Stable, Patent Airway and No signs of Nausea or vomiting  Post-op Vital Signs: Reviewed and stable  Complications: There were no known notable events for this encounter.

## 2022-10-22 NOTE — Anesthesia Postprocedure Evaluation (Signed)
Anesthesia Post Note  Patient: Michelle Baxter  Procedure(s) Performed: CATARACT EXTRACTION PHACO AND INTRAOCULAR LENS PLACEMENT (IOC) RIGHT  10:34  01:01.4 (Right: Eye)  Patient location during evaluation: PACU Anesthesia Type: MAC Level of consciousness: awake and alert Pain management: pain level controlled Vital Signs Assessment: post-procedure vital signs reviewed and stable Respiratory status: spontaneous breathing, nonlabored ventilation, respiratory function stable and patient connected to nasal cannula oxygen Cardiovascular status: stable and blood pressure returned to baseline Postop Assessment: no apparent nausea or vomiting Anesthetic complications: no   There were no known notable events for this encounter.   Last Vitals:  Vitals:   10/22/22 0748 10/22/22 0828  BP: 126/74 112/62  Pulse: 67 64  Resp: 20   Temp: 37.1 C   SpO2: 100% 99%    Last Pain:  Vitals:   10/22/22 0748  TempSrc: Temporal                 Ilene Qua

## 2022-10-23 ENCOUNTER — Encounter: Payer: Self-pay | Admitting: Ophthalmology

## 2022-11-03 NOTE — Discharge Instructions (Signed)

## 2022-11-04 ENCOUNTER — Encounter: Payer: Self-pay | Admitting: Ophthalmology

## 2022-11-04 ENCOUNTER — Other Ambulatory Visit: Payer: Self-pay

## 2022-11-05 ENCOUNTER — Ambulatory Visit
Admission: RE | Admit: 2022-11-05 | Discharge: 2022-11-05 | Disposition: A | Discharge status: Home / Self Care | Payer: 59 | Attending: Ophthalmology | Admitting: Ophthalmology

## 2022-11-05 ENCOUNTER — Ambulatory Visit: Payer: 59 | Admitting: Anesthesiology

## 2022-11-05 ENCOUNTER — Other Ambulatory Visit: Payer: Self-pay

## 2022-11-05 ENCOUNTER — Encounter
Admission: RE | Disposition: A | Discharge status: Home / Self Care | Payer: Self-pay | Source: Home / Self Care | Attending: Ophthalmology

## 2022-11-05 ENCOUNTER — Encounter: Payer: Self-pay | Admitting: Ophthalmology

## 2022-11-05 DIAGNOSIS — I10 Essential (primary) hypertension: Secondary | ICD-10-CM | POA: Diagnosis not present

## 2022-11-05 DIAGNOSIS — H2512 Age-related nuclear cataract, left eye: Secondary | ICD-10-CM | POA: Diagnosis not present

## 2022-11-05 DIAGNOSIS — E039 Hypothyroidism, unspecified: Secondary | ICD-10-CM | POA: Insufficient documentation

## 2022-11-05 DIAGNOSIS — K219 Gastro-esophageal reflux disease without esophagitis: Secondary | ICD-10-CM | POA: Insufficient documentation

## 2022-11-05 HISTORY — PX: CATARACT EXTRACTION W/PHACO: SHX586

## 2022-11-05 SURGERY — PHACOEMULSIFICATION, CATARACT, WITH IOL INSERTION
Anesthesia: Monitor Anesthesia Care | Site: Eye | Laterality: Left

## 2022-11-05 MED ORDER — BRIMONIDINE TARTRATE-TIMOLOL 0.2-0.5 % OP SOLN
OPHTHALMIC | Status: DC | PRN
  Administered 2022-11-05: 1 [drp] via OPHTHALMIC

## 2022-11-05 MED ORDER — FENTANYL CITRATE (PF) 100 MCG/2ML IJ SOLN
INTRAMUSCULAR | Status: DC | PRN
  Administered 2022-11-05: 100 ug via INTRAVENOUS

## 2022-11-05 MED ORDER — CEFUROXIME OPHTHALMIC INJECTION 1 MG/0.1 ML
INJECTION | OPHTHALMIC | Status: DC | PRN
  Administered 2022-11-05: .1 mL via INTRACAMERAL

## 2022-11-05 MED ORDER — LACTATED RINGERS IV SOLN: INTRAVENOUS | Status: DC

## 2022-11-05 MED ORDER — SIGHTPATH DOSE#1 BSS IO SOLN
INTRAOCULAR | Status: DC | PRN
  Administered 2022-11-05: 15 mL

## 2022-11-05 MED ORDER — SIGHTPATH DOSE#1 BSS IO SOLN
INTRAOCULAR | Status: DC | PRN
  Administered 2022-11-05: 1 mL via INTRAMUSCULAR

## 2022-11-05 MED ORDER — ARMC OPHTHALMIC DILATING DROPS
1.0000 | OPHTHALMIC | Status: AC | PRN
  Administered 2022-11-05 (×3): 1 via OPHTHALMIC

## 2022-11-05 MED ORDER — SIGHTPATH DOSE#1 NA HYALUR & NA CHOND-NA HYALUR IO KIT
PACK | INTRAOCULAR | Status: DC | PRN
  Administered 2022-11-05: 1 via OPHTHALMIC

## 2022-11-05 MED ORDER — MIDAZOLAM HCL 2 MG/2ML IJ SOLN
INTRAMUSCULAR | Status: DC | PRN
  Administered 2022-11-05: 2 mg via INTRAVENOUS

## 2022-11-05 MED ORDER — SIGHTPATH DOSE#1 BSS IO SOLN
INTRAOCULAR | Status: DC | PRN
  Administered 2022-11-05: 56 mL via OPHTHALMIC

## 2022-11-05 MED ORDER — ONDANSETRON HCL 4 MG/2ML IJ SOLN: 4.0000 mg | Freq: Once | INTRAMUSCULAR | Status: DC | PRN

## 2022-11-05 MED ORDER — TETRACAINE HCL 0.5 % OP SOLN
1.0000 [drp] | OPHTHALMIC | Status: AC | PRN
  Administered 2022-11-05 (×3): 1 [drp] via OPHTHALMIC

## 2022-11-05 SURGICAL SUPPLY — 10 items
CATARACT SUITE SIGHTPATH (MISCELLANEOUS) ×1 IMPLANT
FEE CATARACT SUITE SIGHTPATH (MISCELLANEOUS) ×1 IMPLANT
GLOVE SRG 8 PF TXTR STRL LF DI (GLOVE) ×1 IMPLANT
GLOVE SURG ENC TEXT LTX SZ7.5 (GLOVE) ×1 IMPLANT
GLOVE SURG UNDER POLY LF SZ8 (GLOVE) ×1
LENS IOL TECNIS EYHANCE 25.5 (Intraocular Lens) IMPLANT
NDL FILTER BLUNT 18X1 1/2 (NEEDLE) ×1 IMPLANT
NEEDLE FILTER BLUNT 18X1 1/2 (NEEDLE) ×1 IMPLANT
SYR 3ML LL SCALE MARK (SYRINGE) ×1 IMPLANT
WATER STERILE IRR 250ML POUR (IV SOLUTION) ×1 IMPLANT

## 2022-11-05 NOTE — Anesthesia Postprocedure Evaluation (Signed)
Anesthesia Post Note  Patient: Michelle Baxter  Procedure(s) Performed: CATARACT EXTRACTION PHACO AND INTRAOCULAR LENS PLACEMENT (IOC) LEFT 5.96 00:45.5 (Left: Eye)  Patient location during evaluation: PACU Anesthesia Type: MAC Level of consciousness: awake and alert Pain management: pain level controlled Vital Signs Assessment: post-procedure vital signs reviewed and stable Respiratory status: spontaneous breathing, nonlabored ventilation, respiratory function stable and patient connected to nasal cannula oxygen Cardiovascular status: stable and blood pressure returned to baseline Postop Assessment: no apparent nausea or vomiting Anesthetic complications: no   There were no known notable events for this encounter.   Last Vitals:  Vitals:   11/05/22 1021 11/05/22 1026  BP: 109/66 105/67  Pulse: 73 88  Resp: (!) 9 13  Temp: (!) 36.2 C (!) 36.2 C  SpO2: 95% 96%    Last Pain:  Vitals:   11/05/22 1026  TempSrc:   PainSc: 0-No pain                 Arita Miss

## 2022-11-05 NOTE — Op Note (Signed)
OPERATIVE NOTE  Michelle Baxter [id number] 11/05/2022   PREOPERATIVE DIAGNOSIS:  Nuclear sclerotic cataract left eye. H25.12   POSTOPERATIVE DIAGNOSIS:    Nuclear sclerotic cataract left eye.     PROCEDURE:  Phacoemusification with posterior chamber intraocular lens placement of the left eye  Ultrasound time: Procedure(s): CATARACT EXTRACTION PHACO AND INTRAOCULAR LENS PLACEMENT (IOC) LEFT 5.96 00:45.5 (Left)  LENS:   Implant Name Type Inv. Item Serial No. Manufacturer Lot No. LRB No. Used Action  LENS IOL TECNIS EYHANCE 25.5 - [id number] Intraocular Lens LENS IOL TECNIS EYHANCE 25.5 [id number] [employer]  Left 1 Implanted      SURGEON:  Deirdre Evener, MD   ANESTHESIA:  Topical with tetracaine drops and 2% Xylocaine jelly, augmented with 1% preservative-free intracameral lidocaine.    COMPLICATIONS:  None.   DESCRIPTION OF PROCEDURE:  The patient was identified in the holding room and transported to the operating room and placed in the supine position under the operating microscope.  The left eye was identified as the operative eye and it was prepped and draped in the usual sterile ophthalmic fashion.   A 1 millimeter clear-corneal paracentesis was made at the 1:30 position.  0.5 ml of preservative-free 1% lidocaine was injected into the anterior chamber.  The anterior chamber was filled with Viscoat viscoelastic.  A 2.4 millimeter keratome was used to make a near-clear corneal incision at the 10:30 position.  .  A curvilinear capsulorrhexis was made with a cystotome and capsulorrhexis forceps.  Balanced salt solution was used to hydrodissect and hydrodelineate the nucleus.   Phacoemulsification was then used in stop and chop fashion to remove the lens nucleus and epinucleus.  The remaining cortex was then removed using the irrigation and aspiration handpiece. Provisc was then placed into the capsular bag to distend it for lens placement.  A lens was then injected into the  capsular bag.  The remaining viscoelastic was aspirated.   Wounds were hydrated with balanced salt solution.  The anterior chamber was inflated to a physiologic pressure with balanced salt solution.  No wound leaks were noted. Cefuroxime 0.1 ml of a 10mg /ml solution was injected into the anterior chamber for a dose of 1 mg of intracameral antibiotic at the completion of the case.   Timolol and Brimonidine drops were applied to the eye.  The patient was taken to the recovery room in stable condition without complications of anesthesia or surgery.  Charisse Wendell 11/05/2022, 10:19 AM

## 2022-11-05 NOTE — Transfer of Care (Signed)
Immediate Anesthesia Transfer of Care Note  Patient: Michelle Baxter  Procedure(s) Performed: CATARACT EXTRACTION PHACO AND INTRAOCULAR LENS PLACEMENT (IOC) LEFT 5.96 00:45.5 (Left: Eye)  Patient Location: PACU  Anesthesia Type: MAC  Level of Consciousness: awake, alert  and patient cooperative  Airway and Oxygen Therapy: Patient Spontanous Breathing and Patient connected to supplemental oxygen  Post-op Assessment: Post-op Vital signs reviewed, Patient's Cardiovascular Status Stable, Respiratory Function Stable, Patent Airway and No signs of Nausea or vomiting  Post-op Vital Signs: Reviewed and stable  Complications: No notable events documented.

## 2022-11-05 NOTE — Anesthesia Preprocedure Evaluation (Signed)
Anesthesia Evaluation  Patient identified by MRN, date of birth, ID band Patient awake    Reviewed: Allergy & Precautions, NPO status , Patient's Chart, lab work & pertinent test results  History of Anesthesia Complications (+) PONV and history of anesthetic complications  Airway Mallampati: II  TM Distance: >3 FB Neck ROM: full    Dental  (+) Partial Lower   Pulmonary neg pulmonary ROS, neg sleep apnea, neg COPD, Patient abstained from smoking.Not current smoker   Pulmonary exam normal breath sounds clear to auscultation       Cardiovascular Exercise Tolerance: Good METShypertension, On Medications (-) CAD and (-) Past MI Normal cardiovascular exam(-) dysrhythmias  Rhythm:Regular Rate:Normal - Systolic murmurs    Neuro/Psych negative neurological ROS  negative psych ROS   GI/Hepatic Neg liver ROS,GERD  ,,  Endo/Other  neg diabetesHypothyroidism    Renal/GU negative Renal ROS     Musculoskeletal   Abdominal   Peds  Hematology negative hematology ROS (+)   Anesthesia Other Findings Past Medical History: No date: Arthritis     Comment:  shoulders No date: Dental bridge present     Comment:  permanent top, removable lower No date: GERD (gastroesophageal reflux disease) No date: Hypertension No date: Hypothyroidism No date: PONV (postoperative nausea and vomiting)     Comment:  after cone biopsy No date: Pre-diabetes  Past Surgical History: No date: ABDOMINAL HYSTERECTOMY No date: CERVICAL CONE BIOPSY  BMI    Body Mass Index: 24.51 kg/m      Reproductive/Obstetrics negative OB ROS                              Anesthesia Physical Anesthesia Plan  ASA: 2  Anesthesia Plan: MAC   Post-op Pain Management: Minimal or no pain anticipated   Induction: Intravenous  PONV Risk Score and Plan: 3 and Midazolam  Airway Management Planned: Natural Airway and Nasal  Cannula  Additional Equipment:   Intra-op Plan:   Post-operative Plan:   Informed Consent: I have reviewed the patients History and Physical, chart, labs and discussed the procedure including the risks, benefits and alternatives for the proposed anesthesia with the patient or authorized representative who has indicated his/her understanding and acceptance.     Dental Advisory Given and Interpreter used for interveiw  Plan Discussed with: Anesthesiologist, CRNA and Surgeon  Anesthesia Plan Comments: (Explained risks of anesthesia, including PONV, and rare emergencies such as cardiac events, respiratory problems, and allergic reactions, requiring invasive intervention. Discussed the role of CRNA in patient's perioperative care. Patient understands. )        Anesthesia Quick Evaluation

## 2022-11-05 NOTE — H&P (Signed)
  [facility]   Primary Care Physician:  Neita Goodnight Presley Raddle, MD Ophthalmologist: Dr. Lockie Mola  Pre-Procedure History & Physical: HPI:  Michelle Baxter is a 61 y.o. female here for ophthalmic surgery.   Past Medical History:  Diagnosis Date   Arthritis    shoulders   Dental bridge present    permanent top, removable lower   GERD (gastroesophageal reflux disease)    Hypertension    Hypothyroidism    PONV (postoperative nausea and vomiting)    after cone biopsy   Pre-diabetes     Past Surgical History:  Procedure Laterality Date   ABDOMINAL HYSTERECTOMY     CATARACT EXTRACTION W/PHACO Right 10/22/2022   Procedure: CATARACT EXTRACTION PHACO AND INTRAOCULAR LENS PLACEMENT (IOC) RIGHT  10:34  01:01.4;  Surgeon: Lockie Mola, MD;  [facility]: [facility];  Service: Ophthalmology;  Laterality: Right;   CERVICAL CONE BIOPSY      Prior to Admission medications   Medication Sig Start Date End Date Taking? Authorizing Provider  amLODipine (NORVASC) 5 MG tablet Take 1 tablet (5 mg total) by mouth daily. 05/30/22 05/30/23  Phineas Semen, MD  cetirizine (ZYRTEC) 10 MG tablet Take 10 mg by mouth daily.    [provider]  levothyroxine (SYNTHROID) 50 MCG tablet Take 50 mcg by mouth daily before breakfast.    [provider]  losartan (COZAAR) 100 MG tablet Take 100 mg by mouth daily.    [provider]  omeprazole (PRILOSEC) 40 MG capsule Take 40 mg by mouth daily.    [provider]    Allergies as of 07/17/2022   (No Known Allergies)    Family History  Problem Relation Age of Onset   Breast cancer Neg Hx     Social History   Socioeconomic History   Marital status: Married    Spouse name: Not on file   Number of children: Not on file   Years of education: Not on file   Highest education level: Not on file  Occupational History   Not on file  Tobacco Use   Smoking status: Never   Smokeless  tobacco: Never  Vaping Use   Vaping Use: Never used  Substance and Sexual Activity   Alcohol use: Not Currently   Drug use: Never   Sexual activity: Not on file  Other Topics Concern   Not on file  Social History Narrative   Not on file   Social Determinants of Health   Financial Resource Strain: Not on file  Food Insecurity: Not on file  Transportation Needs: Not on file  Physical Activity: Not on file  Stress: Not on file  Social Connections: Not on file  Intimate Partner Violence: Not on file    Review of Systems: See HPI, otherwise negative ROS  Physical Exam: There were no vitals taken for this visit. General:   Alert,  pleasant and cooperative in NAD Head:  Normocephalic and atraumatic. Lungs:  Clear to auscultation.    Heart:  Regular rate and rhythm.   Impression/Plan: Michelle Baxter is here for ophthalmic surgery.  Risks, benefits, limitations, and alternatives regarding ophthalmic surgery have been reviewed with the patient.  Questions have been answered.  All parties agreeable.   Lockie Mola, MD  11/05/2022, 9:10 AM

## 2022-11-06 ENCOUNTER — Encounter: Payer: Self-pay | Admitting: Ophthalmology

## 2023-02-26 DIAGNOSIS — M9901 Segmental and somatic dysfunction of cervical region: Secondary | ICD-10-CM | POA: Diagnosis not present

## 2023-02-26 DIAGNOSIS — M546 Pain in thoracic spine: Secondary | ICD-10-CM | POA: Diagnosis not present

## 2023-02-26 DIAGNOSIS — M9902 Segmental and somatic dysfunction of thoracic region: Secondary | ICD-10-CM | POA: Diagnosis not present

## 2023-02-26 DIAGNOSIS — M5412 Radiculopathy, cervical region: Secondary | ICD-10-CM | POA: Diagnosis not present

## 2023-03-02 DIAGNOSIS — M9902 Segmental and somatic dysfunction of thoracic region: Secondary | ICD-10-CM | POA: Diagnosis not present

## 2023-03-02 DIAGNOSIS — M9901 Segmental and somatic dysfunction of cervical region: Secondary | ICD-10-CM | POA: Diagnosis not present

## 2023-03-02 DIAGNOSIS — M546 Pain in thoracic spine: Secondary | ICD-10-CM | POA: Diagnosis not present

## 2023-03-02 DIAGNOSIS — M5412 Radiculopathy, cervical region: Secondary | ICD-10-CM | POA: Diagnosis not present

## 2023-03-04 DIAGNOSIS — M9901 Segmental and somatic dysfunction of cervical region: Secondary | ICD-10-CM | POA: Diagnosis not present

## 2023-03-04 DIAGNOSIS — M9902 Segmental and somatic dysfunction of thoracic region: Secondary | ICD-10-CM | POA: Diagnosis not present

## 2023-03-04 DIAGNOSIS — M546 Pain in thoracic spine: Secondary | ICD-10-CM | POA: Diagnosis not present

## 2023-03-04 DIAGNOSIS — M5412 Radiculopathy, cervical region: Secondary | ICD-10-CM | POA: Diagnosis not present

## 2023-03-05 DIAGNOSIS — M5412 Radiculopathy, cervical region: Secondary | ICD-10-CM | POA: Diagnosis not present

## 2023-03-05 DIAGNOSIS — M9902 Segmental and somatic dysfunction of thoracic region: Secondary | ICD-10-CM | POA: Diagnosis not present

## 2023-03-05 DIAGNOSIS — M9901 Segmental and somatic dysfunction of cervical region: Secondary | ICD-10-CM | POA: Diagnosis not present

## 2023-03-05 DIAGNOSIS — M546 Pain in thoracic spine: Secondary | ICD-10-CM | POA: Diagnosis not present

## 2023-03-09 DIAGNOSIS — M5412 Radiculopathy, cervical region: Secondary | ICD-10-CM | POA: Diagnosis not present

## 2023-03-09 DIAGNOSIS — M546 Pain in thoracic spine: Secondary | ICD-10-CM | POA: Diagnosis not present

## 2023-03-09 DIAGNOSIS — M9901 Segmental and somatic dysfunction of cervical region: Secondary | ICD-10-CM | POA: Diagnosis not present

## 2023-03-09 DIAGNOSIS — M9902 Segmental and somatic dysfunction of thoracic region: Secondary | ICD-10-CM | POA: Diagnosis not present

## 2023-03-10 DIAGNOSIS — M9901 Segmental and somatic dysfunction of cervical region: Secondary | ICD-10-CM | POA: Diagnosis not present

## 2023-03-10 DIAGNOSIS — M9902 Segmental and somatic dysfunction of thoracic region: Secondary | ICD-10-CM | POA: Diagnosis not present

## 2023-03-10 DIAGNOSIS — M546 Pain in thoracic spine: Secondary | ICD-10-CM | POA: Diagnosis not present

## 2023-03-10 DIAGNOSIS — M5412 Radiculopathy, cervical region: Secondary | ICD-10-CM | POA: Diagnosis not present

## 2023-03-16 DIAGNOSIS — M9901 Segmental and somatic dysfunction of cervical region: Secondary | ICD-10-CM | POA: Diagnosis not present

## 2023-03-16 DIAGNOSIS — M9902 Segmental and somatic dysfunction of thoracic region: Secondary | ICD-10-CM | POA: Diagnosis not present

## 2023-03-16 DIAGNOSIS — M5412 Radiculopathy, cervical region: Secondary | ICD-10-CM | POA: Diagnosis not present

## 2023-03-16 DIAGNOSIS — M546 Pain in thoracic spine: Secondary | ICD-10-CM | POA: Diagnosis not present

## 2023-03-18 DIAGNOSIS — M9901 Segmental and somatic dysfunction of cervical region: Secondary | ICD-10-CM | POA: Diagnosis not present

## 2023-03-18 DIAGNOSIS — M546 Pain in thoracic spine: Secondary | ICD-10-CM | POA: Diagnosis not present

## 2023-03-18 DIAGNOSIS — M5412 Radiculopathy, cervical region: Secondary | ICD-10-CM | POA: Diagnosis not present

## 2023-03-18 DIAGNOSIS — M9902 Segmental and somatic dysfunction of thoracic region: Secondary | ICD-10-CM | POA: Diagnosis not present

## 2023-03-29 DIAGNOSIS — Z833 Family history of diabetes mellitus: Secondary | ICD-10-CM | POA: Diagnosis not present

## 2023-03-29 DIAGNOSIS — Z8249 Family history of ischemic heart disease and other diseases of the circulatory system: Secondary | ICD-10-CM | POA: Diagnosis not present

## 2023-03-29 DIAGNOSIS — Z803 Family history of malignant neoplasm of breast: Secondary | ICD-10-CM | POA: Diagnosis not present

## 2023-03-29 DIAGNOSIS — E039 Hypothyroidism, unspecified: Secondary | ICD-10-CM | POA: Diagnosis not present

## 2023-03-29 DIAGNOSIS — J309 Allergic rhinitis, unspecified: Secondary | ICD-10-CM | POA: Diagnosis not present

## 2023-03-29 DIAGNOSIS — I10 Essential (primary) hypertension: Secondary | ICD-10-CM | POA: Diagnosis not present

## 2023-03-29 DIAGNOSIS — Z811 Family history of alcohol abuse and dependence: Secondary | ICD-10-CM | POA: Diagnosis not present

## 2023-03-30 DIAGNOSIS — M546 Pain in thoracic spine: Secondary | ICD-10-CM | POA: Diagnosis not present

## 2023-03-30 DIAGNOSIS — M9901 Segmental and somatic dysfunction of cervical region: Secondary | ICD-10-CM | POA: Diagnosis not present

## 2023-03-30 DIAGNOSIS — M9902 Segmental and somatic dysfunction of thoracic region: Secondary | ICD-10-CM | POA: Diagnosis not present

## 2023-03-30 DIAGNOSIS — M5412 Radiculopathy, cervical region: Secondary | ICD-10-CM | POA: Diagnosis not present

## 2023-04-01 DIAGNOSIS — M5412 Radiculopathy, cervical region: Secondary | ICD-10-CM | POA: Diagnosis not present

## 2023-04-01 DIAGNOSIS — M9902 Segmental and somatic dysfunction of thoracic region: Secondary | ICD-10-CM | POA: Diagnosis not present

## 2023-04-01 DIAGNOSIS — M546 Pain in thoracic spine: Secondary | ICD-10-CM | POA: Diagnosis not present

## 2023-04-01 DIAGNOSIS — M9901 Segmental and somatic dysfunction of cervical region: Secondary | ICD-10-CM | POA: Diagnosis not present

## 2023-04-06 DIAGNOSIS — Z1389 Encounter for screening for other disorder: Secondary | ICD-10-CM | POA: Diagnosis not present

## 2023-04-06 DIAGNOSIS — D649 Anemia, unspecified: Secondary | ICD-10-CM | POA: Diagnosis not present

## 2023-04-06 DIAGNOSIS — R946 Abnormal results of thyroid function studies: Secondary | ICD-10-CM | POA: Diagnosis not present

## 2023-04-06 DIAGNOSIS — M47812 Spondylosis without myelopathy or radiculopathy, cervical region: Secondary | ICD-10-CM | POA: Diagnosis not present

## 2023-04-06 DIAGNOSIS — I10 Essential (primary) hypertension: Secondary | ICD-10-CM | POA: Diagnosis not present

## 2023-04-06 DIAGNOSIS — Z0130 Encounter for examination of blood pressure without abnormal findings: Secondary | ICD-10-CM | POA: Diagnosis not present

## 2023-04-06 DIAGNOSIS — R7309 Other abnormal glucose: Secondary | ICD-10-CM | POA: Diagnosis not present

## 2023-04-08 DIAGNOSIS — M546 Pain in thoracic spine: Secondary | ICD-10-CM | POA: Diagnosis not present

## 2023-04-08 DIAGNOSIS — M9902 Segmental and somatic dysfunction of thoracic region: Secondary | ICD-10-CM | POA: Diagnosis not present

## 2023-04-08 DIAGNOSIS — M5412 Radiculopathy, cervical region: Secondary | ICD-10-CM | POA: Diagnosis not present

## 2023-04-08 DIAGNOSIS — M9901 Segmental and somatic dysfunction of cervical region: Secondary | ICD-10-CM | POA: Diagnosis not present

## 2023-04-13 DIAGNOSIS — M546 Pain in thoracic spine: Secondary | ICD-10-CM | POA: Diagnosis not present

## 2023-04-13 DIAGNOSIS — M9902 Segmental and somatic dysfunction of thoracic region: Secondary | ICD-10-CM | POA: Diagnosis not present

## 2023-04-13 DIAGNOSIS — M9901 Segmental and somatic dysfunction of cervical region: Secondary | ICD-10-CM | POA: Diagnosis not present

## 2023-04-13 DIAGNOSIS — M5412 Radiculopathy, cervical region: Secondary | ICD-10-CM | POA: Diagnosis not present

## 2023-04-16 DIAGNOSIS — M5412 Radiculopathy, cervical region: Secondary | ICD-10-CM | POA: Diagnosis not present

## 2023-04-16 DIAGNOSIS — M9901 Segmental and somatic dysfunction of cervical region: Secondary | ICD-10-CM | POA: Diagnosis not present

## 2023-04-16 DIAGNOSIS — M9902 Segmental and somatic dysfunction of thoracic region: Secondary | ICD-10-CM | POA: Diagnosis not present

## 2023-04-16 DIAGNOSIS — M546 Pain in thoracic spine: Secondary | ICD-10-CM | POA: Diagnosis not present

## 2023-04-20 DIAGNOSIS — M5412 Radiculopathy, cervical region: Secondary | ICD-10-CM | POA: Diagnosis not present

## 2023-04-20 DIAGNOSIS — M9902 Segmental and somatic dysfunction of thoracic region: Secondary | ICD-10-CM | POA: Diagnosis not present

## 2023-04-20 DIAGNOSIS — M546 Pain in thoracic spine: Secondary | ICD-10-CM | POA: Diagnosis not present

## 2023-04-20 DIAGNOSIS — M9901 Segmental and somatic dysfunction of cervical region: Secondary | ICD-10-CM | POA: Diagnosis not present

## 2023-04-22 DIAGNOSIS — M5412 Radiculopathy, cervical region: Secondary | ICD-10-CM | POA: Diagnosis not present

## 2023-04-22 DIAGNOSIS — M9902 Segmental and somatic dysfunction of thoracic region: Secondary | ICD-10-CM | POA: Diagnosis not present

## 2023-04-22 DIAGNOSIS — M9901 Segmental and somatic dysfunction of cervical region: Secondary | ICD-10-CM | POA: Diagnosis not present

## 2023-04-22 DIAGNOSIS — M546 Pain in thoracic spine: Secondary | ICD-10-CM | POA: Diagnosis not present

## 2023-04-29 DIAGNOSIS — M5412 Radiculopathy, cervical region: Secondary | ICD-10-CM | POA: Diagnosis not present

## 2023-04-29 DIAGNOSIS — M9902 Segmental and somatic dysfunction of thoracic region: Secondary | ICD-10-CM | POA: Diagnosis not present

## 2023-04-29 DIAGNOSIS — M9901 Segmental and somatic dysfunction of cervical region: Secondary | ICD-10-CM | POA: Diagnosis not present

## 2023-04-29 DIAGNOSIS — M546 Pain in thoracic spine: Secondary | ICD-10-CM | POA: Diagnosis not present

## 2023-05-05 DIAGNOSIS — M5412 Radiculopathy, cervical region: Secondary | ICD-10-CM | POA: Diagnosis not present

## 2023-05-05 DIAGNOSIS — M9901 Segmental and somatic dysfunction of cervical region: Secondary | ICD-10-CM | POA: Diagnosis not present

## 2023-05-05 DIAGNOSIS — M9902 Segmental and somatic dysfunction of thoracic region: Secondary | ICD-10-CM | POA: Diagnosis not present

## 2023-05-05 DIAGNOSIS — M546 Pain in thoracic spine: Secondary | ICD-10-CM | POA: Diagnosis not present

## 2023-06-08 DIAGNOSIS — Z961 Presence of intraocular lens: Secondary | ICD-10-CM | POA: Diagnosis not present

## 2023-06-08 DIAGNOSIS — M3501 Sicca syndrome with keratoconjunctivitis: Secondary | ICD-10-CM | POA: Diagnosis not present

## 2024-01-21 ENCOUNTER — Encounter: Payer: Self-pay | Admitting: Internal Medicine

## 2024-01-21 ENCOUNTER — Other Ambulatory Visit: Payer: Self-pay

## 2024-01-21 ENCOUNTER — Ambulatory Visit: Payer: No Typology Code available for payment source | Admitting: Internal Medicine

## 2024-01-21 VITALS — BP 130/70 | HR 97 | Temp 98.0°F | Resp 16 | Wt 135.0 lb

## 2024-01-21 DIAGNOSIS — R7303 Prediabetes: Secondary | ICD-10-CM | POA: Insufficient documentation

## 2024-01-21 DIAGNOSIS — Z1211 Encounter for screening for malignant neoplasm of colon: Secondary | ICD-10-CM

## 2024-01-21 DIAGNOSIS — I10 Essential (primary) hypertension: Secondary | ICD-10-CM | POA: Diagnosis not present

## 2024-01-21 DIAGNOSIS — E538 Deficiency of other specified B group vitamins: Secondary | ICD-10-CM

## 2024-01-21 DIAGNOSIS — K219 Gastro-esophageal reflux disease without esophagitis: Secondary | ICD-10-CM

## 2024-01-21 DIAGNOSIS — M25542 Pain in joints of left hand: Secondary | ICD-10-CM

## 2024-01-21 DIAGNOSIS — E039 Hypothyroidism, unspecified: Secondary | ICD-10-CM

## 2024-01-21 DIAGNOSIS — Z23 Encounter for immunization: Secondary | ICD-10-CM

## 2024-01-21 DIAGNOSIS — R252 Cramp and spasm: Secondary | ICD-10-CM

## 2024-01-21 DIAGNOSIS — Z1231 Encounter for screening mammogram for malignant neoplasm of breast: Secondary | ICD-10-CM

## 2024-01-21 DIAGNOSIS — Z13220 Encounter for screening for lipoid disorders: Secondary | ICD-10-CM

## 2024-01-21 MED ORDER — LOSARTAN POTASSIUM 100 MG PO TABS: 100.0000 mg | ORAL_TABLET | Freq: Every day | ORAL | 1 refills | Status: DC

## 2024-01-21 MED ORDER — OMEPRAZOLE 40 MG PO CPDR
40.0000 mg | DELAYED_RELEASE_CAPSULE | Freq: Every day | ORAL | 1 refills | Status: DC
Start: 2024-01-21 — End: 2024-07-26

## 2024-01-21 MED ORDER — TIZANIDINE HCL 4 MG PO TABS: 4.0000 mg | ORAL_TABLET | Freq: Four times a day (QID) | ORAL | 0 refills | Status: AC | PRN

## 2024-01-21 MED ORDER — AMLODIPINE BESYLATE 5 MG PO TABS: 5.0000 mg | ORAL_TABLET | Freq: Every day | ORAL | 1 refills | Status: DC

## 2024-01-21 NOTE — Progress Notes (Signed)
New Patient Office Visit  Subjective    Patient ID: Michelle Baxter, female    DOB: 05/25/1961  Age: 63 y.o. MRN: [id number]  CC:  Chief Complaint  Patient presents with   Establish Care    HPI Michelle Baxter presents to establish care.  A translator was present to aid in communication.  Hypertension: -Medications: Amlodipine 5 mg, Losartan 100 mg has been on for about 3 years -Patient is compliant with above medications and reports no side effects. -Checking BP at home (average): Only checks when she has symptoms of hypertension like headaches -Denies any SOB, CP, vision changes, LE edema or symptoms of hypotension  Hypothyroidism: -Medications: Levothyroxine 50 mcg -Patient is compliant with the above medication (s) at the above dose and reports no medication side effects.  -Denies weight changes, cold./heat intolerance, skin changes, anxiety/palpitations  -Last TSH: uncertain -Thyroid US 2016 normal   Pre-Diabetes: -Last A1c in EMR in 2013 5.4% - states she had one 8 months ago but does not remember the number was told it was in the prediabetic range -Not on medications  GERD: -Currently on Prilosec 40 mg, as needed, usually takes about 2 or 3 times a week  Environmental Allergies: -Currently on Zyrtec 10 mg seasonally  Arthritis:  -In neck and shoulders -Will take Tylenol which helps a little bit but does not remove pain  -Has not noticed pain, erythema and swelling over the knuckle on the second digit on her left hand  Health Maintenance: -Blood work due -Mammogram due -Pap - history of complete hysterectomy - had it 8/23 negative  -Colon cancer screening due -Tdap and flu due  Outpatient Encounter Medications as of 01/21/2024  Medication Sig   cetirizine (ZYRTEC) 10 MG tablet Take 10 mg by mouth daily.   levothyroxine (SYNTHROID) 50 MCG tablet Take 50 mcg by mouth daily before breakfast.   tiZANidine (ZANAFLEX) 4 MG tablet Take 1 tablet (4 mg  total) by mouth every 6 (six) hours as needed for muscle spasms.   [DISCONTINUED] losartan (COZAAR) 100 MG tablet Take 100 mg by mouth daily.   [DISCONTINUED] omeprazole (PRILOSEC) 40 MG capsule Take 40 mg by mouth daily.   amLODipine (NORVASC) 5 MG tablet Take 1 tablet (5 mg total) by mouth daily.   losartan (COZAAR) 100 MG tablet Take 1 tablet (100 mg total) by mouth daily.   omeprazole (PRILOSEC) 40 MG capsule Take 1 capsule (40 mg total) by mouth daily.   [DISCONTINUED] amLODipine (NORVASC) 5 MG tablet Take 1 tablet (5 mg total) by mouth daily.   No facility-administered encounter medications on file as of 01/21/2024.    Past Medical History:  Diagnosis Date   Arthritis    shoulders   Dental bridge present    permanent top, removable lower   GERD (gastroesophageal reflux disease)    Hypertension    Hypothyroidism    PONV (postoperative nausea and vomiting)    after cone biopsy   Pre-diabetes     Past Surgical History:  Procedure Laterality Date   ABDOMINAL HYSTERECTOMY     CATARACT EXTRACTION W/PHACO Right 10/22/2022   Procedure: CATARACT EXTRACTION PHACO AND INTRAOCULAR LENS PLACEMENT (IOC) RIGHT  10:34  01:01.4;  Surgeon: Lockie Mola, MD;  Location: [facility];  Service: Ophthalmology;  Laterality: Right;   CATARACT EXTRACTION W/PHACO Left 11/05/2022   Procedure: CATARACT EXTRACTION PHACO AND INTRAOCULAR LENS PLACEMENT (IOC) LEFT 5.96 00:45.5;  Surgeon: Lockie Mola, MD;  Location: [facility];  Service: Ophthalmology;  Laterality: Left;   CERVICAL CONE BIOPSY      Family History  Problem Relation Age of Onset   Breast cancer Neg Hx     Social History   Socioeconomic History   Marital status: Married    Spouse name: Not on file   Number of children: 2   Years of education: Not on file   Highest education level: Not on file  Occupational History   Not on file  Tobacco Use   Smoking status: Never    Passive exposure: Never    Smokeless tobacco: Never  Vaping Use   Vaping status: Never Used  Substance and Sexual Activity   Alcohol use: Not Currently   Drug use: Never   Sexual activity: Not Currently  Other Topics Concern   Not on file  Social History Narrative   Not on file   Social Drivers of Health   Financial Resource Strain: Not on file  Food Insecurity: Not on file  Transportation Needs: Not on file  Physical Activity: Not on file  Stress: Not on file  Social Connections: Not on file  Intimate Partner Violence: Not on file    Review of Systems  Constitutional:  Negative for chills and fever.  Eyes:  Negative for blurred vision.  Respiratory:  Negative for shortness of breath.   Cardiovascular:  Negative for chest pain.  Musculoskeletal:  Positive for joint pain.        Objective    BP 130/70 (Cuff Size: Normal)   Pulse 97   Temp 98 F (36.7 C) (Oral)   Resp 16   Wt 135 lb (61.2 kg)   SpO2 98%   BMI 24.69 kg/m   Physical Exam Constitutional:      Appearance: Normal appearance.  HENT:     Head: Normocephalic and atraumatic.     Mouth/Throat:     Mouth: Mucous membranes are moist.     Pharynx: Oropharynx is clear.  Eyes:     Extraocular Movements: Extraocular movements intact.     Conjunctiva/sclera: Conjunctivae normal.     Pupils: Pupils are equal, round, and reactive to light.  Neck:     Comments: No thyromegaly Cardiovascular:     Rate and Rhythm: Normal rate and regular rhythm.  Pulmonary:     Effort: Pulmonary effort is normal.     Breath sounds: Normal breath sounds.  Musculoskeletal:     Cervical back: No tenderness.     Right lower leg: No edema.     Left lower leg: No edema.  Lymphadenopathy:     Cervical: No cervical adenopathy.  Skin:    General: Skin is warm and dry.     Comments: Second MCP on the left hand with mild erythema, swelling and pain  Neurological:     General: No focal deficit present.     Mental Status: She is alert. Mental status is  at baseline.  Psychiatric:        Mood and Affect: Mood normal.        Behavior: Behavior normal.         Assessment & Plan:  Hypertension, unspecified type Assessment & Plan: Blood pressure well-controlled.  Continue amlodipine 5 mg and losartan 100 mg, refilled today.  Labs due.  Orders: -     CBC with Differential/Platelet -     COMPLETE METABOLIC PANEL WITH GFR -     Losartan Potassium; Take 1 tablet (100 mg total) by mouth daily.  Dispense: 90 tablet; Refill:  1 -     amLODIPine Besylate; Take 1 tablet (5 mg total) by mouth daily.  Dispense: 90 tablet; Refill: 1  Hypothyroidism, unspecified type Assessment & Plan: Reviewed normal thyroid ultrasound from 2016.  Check TSH today.  Will refill levothyroxine after lab results.  Orders: -     TSH  Prediabetes Assessment & Plan: Recheck A1c.  Orders: -     Hemoglobin A1c  Gastroesophageal reflux disease, unspecified whether esophagitis present Assessment & Plan: Symptoms stable, taking Prilosec 40 mg 2-3 times a week as needed.  Orders: -     Omeprazole; Take 1 capsule (40 mg total) by mouth daily.  Dispense: 90 capsule; Refill: 1  Arthralgia of left hand Assessment & Plan: Will check inflammatory markers and uric acid although on physical exam it does appear to be osteoarthritis.  Discussed using Voltaren gel and can take anti-inflammatories as needed with food alternated with Tylenol.  Orders: -     Sedimentation rate -     C-reactive protein -     Uric acid  Cramps of lower extremity -     tiZANidine HCl; Take 1 tablet (4 mg total) by mouth every 6 (six) hours as needed for muscle spasms.  Dispense: 30 tablet; Refill: 0  Vitamin B12 deficiency Assessment & Plan: History of vitamin B12 deficiency, not currently on supplements.  Recheck levels.  Orders: -     Vitamin B12  Lipid screening -     Lipid panel  Encounter for screening mammogram for malignant neoplasm of breast -     3D Screening Mammogram,  Left and Right; Future  Colon cancer screening -     Ambulatory referral to Gastroenterology  Flu vaccine need -     Flu vaccine trivalent PF, 6mos and older(Flulaval,Afluria,Fluarix,Fluzone)  Other orders -     Tdap vaccine greater than or equal to 7yo IM  Screening labs ordered as well as referral for GI and mammogram.  Flu vaccine and tetanus administered today.  Return in about 6 months (around 07/20/2024).   Margarita Mail, DO

## 2024-01-21 NOTE — Assessment & Plan Note (Signed)
Symptoms stable, taking Prilosec 40 mg 2-3 times a week as needed.

## 2024-01-21 NOTE — Assessment & Plan Note (Signed)
Recheck A1c

## 2024-01-21 NOTE — Assessment & Plan Note (Signed)
Blood pressure well-controlled.  Continue amlodipine 5 mg and losartan 100 mg, refilled today.  Labs due.

## 2024-01-21 NOTE — Assessment & Plan Note (Signed)
Reviewed normal thyroid ultrasound from 2016.  Check TSH today.  Will refill levothyroxine after lab results.

## 2024-01-21 NOTE — Assessment & Plan Note (Signed)
History of vitamin B12 deficiency, not currently on supplements.  Recheck levels.

## 2024-01-21 NOTE — Assessment & Plan Note (Signed)
Will check inflammatory markers and uric acid although on physical exam it does appear to be osteoarthritis.  Discussed using Voltaren gel and can take anti-inflammatories as needed with food alternated with Tylenol.

## 2024-01-23 LAB — CBC WITH DIFFERENTIAL/PLATELET
Absolute Lymphocytes: 1034 {cells}/uL (ref 850–3900)
Absolute Monocytes: 455 {cells}/uL (ref 200–950)
Basophils Absolute: 59 {cells}/uL (ref 0–200)
Basophils Relative: 0.9 %
Eosinophils Absolute: 111 {cells}/uL (ref 15–500)
Eosinophils Relative: 1.7 %
HCT: 40.2 % (ref 35.0–45.0)
Hemoglobin: 13.5 g/dL (ref 11.7–15.5)
MCH: 30 pg (ref 27.0–33.0)
MCHC: 33.6 g/dL (ref 32.0–36.0)
MCV: 89.3 fL (ref 80.0–100.0)
MPV: 11.3 fL (ref 7.5–12.5)
Monocytes Relative: 7 %
Neutro Abs: 4843 {cells}/uL (ref 1500–7800)
Neutrophils Relative %: 74.5 %
Platelets: 333 10*3/uL (ref 140–400)
RBC: 4.5 10*6/uL (ref 3.80–5.10)
RDW: 13.4 % (ref 11.0–15.0)
Total Lymphocyte: 15.9 %
WBC: 6.5 10*3/uL (ref 3.8–10.8)

## 2024-01-23 LAB — COMPLETE METABOLIC PANEL WITH GFR
AG Ratio: 1.9 (calc) (ref 1.0–2.5)
ALT: 16 U/L (ref 6–29)
AST: 18 U/L (ref 10–35)
Albumin: 4.8 g/dL (ref 3.6–5.1)
Alkaline phosphatase (APISO): 74 U/L (ref 37–153)
BUN: 20 mg/dL (ref 7–25)
CO2: 28 mmol/L (ref 20–32)
Calcium: 9.9 mg/dL (ref 8.6–10.4)
Chloride: 105 mmol/L (ref 98–110)
Creat: 0.68 mg/dL (ref 0.50–1.05)
Globulin: 2.5 g/dL (ref 1.9–3.7)
Glucose, Bld: 103 mg/dL — ABNORMAL HIGH (ref 65–99)
Potassium: 4.4 mmol/L (ref 3.5–5.3)
Sodium: 142 mmol/L (ref 135–146)
Total Bilirubin: 0.6 mg/dL (ref 0.2–1.2)
Total Protein: 7.3 g/dL (ref 6.1–8.1)
eGFR: 98 mL/min/{1.73_m2} (ref >=60)

## 2024-01-23 LAB — LIPID PANEL
Cholesterol: 236 mg/dL — ABNORMAL HIGH (ref <200)
HDL: 71 mg/dL (ref >=50)
LDL Cholesterol (Calc): 149 mg/dL — ABNORMAL HIGH
Non-HDL Cholesterol (Calc): 165 mg/dL — ABNORMAL HIGH (ref <130)
Total CHOL/HDL Ratio: 3.3 (calc) (ref <5.0)
Triglycerides: 63 mg/dL (ref <150)

## 2024-01-23 LAB — HEMOGLOBIN A1C
Hgb A1c MFr Bld: 5.9 %{Hb} — ABNORMAL HIGH (ref <5.7)
Mean Plasma Glucose: 123 mg/dL
eAG (mmol/L): 6.8 mmol/L

## 2024-01-23 LAB — URIC ACID: Uric Acid, Serum: 3.3 mg/dL (ref 2.5–7.0)

## 2024-01-23 LAB — TSH: TSH: 2.96 m[IU]/L (ref 0.40–4.50)

## 2024-01-23 LAB — SEDIMENTATION RATE: Sed Rate: 11 mm/h (ref 0–30)

## 2024-01-23 LAB — C-REACTIVE PROTEIN: CRP: <3 mg/L (ref <8.0)

## 2024-01-23 LAB — VITAMIN B12: Vitamin B-12: 636 pg/mL (ref 200–1100)

## 2024-01-25 MED ORDER — LEVOTHYROXINE SODIUM 50 MCG PO TABS: 50.0000 ug | ORAL_TABLET | Freq: Every day | ORAL | 1 refills | Status: DC

## 2024-01-25 NOTE — Addendum Note (Signed)
Addended by: Margarita Mail on: 01/25/2024 09:08 AM   Modules accepted: Orders

## 2024-02-11 ENCOUNTER — Encounter: Payer: Self-pay | Admitting: *Deleted

## 2024-02-23 ENCOUNTER — Telehealth: Payer: Self-pay

## 2024-02-23 NOTE — Telephone Encounter (Signed)
 Patient's sister called-Sylvia asking to schedule a colonoscopy for her sister. I told her that I needed to check first if she was in her DPR. I then told her that I was not allowed to talk to her as patient did not authorize anybody other than her husband Theotis Barrio. She understood and asked if I could call her sister.  I then called the patient twice and was hung up twice.

## 2024-04-11 ENCOUNTER — Other Ambulatory Visit: Payer: Self-pay

## 2024-04-11 DIAGNOSIS — Z1211 Encounter for screening for malignant neoplasm of colon: Secondary | ICD-10-CM

## 2024-04-11 MED ORDER — NA SULFATE-K SULFATE-MG SULF 17.5-3.13-1.6 GM/177ML PO SOLN: 354.0000 mL | Freq: Once | ORAL | 0 refills | Status: AC

## 2024-05-10 ENCOUNTER — Telehealth: Payer: Self-pay

## 2024-05-10 MED ORDER — PEG 3350-KCL-NA BICARB-NACL 420 G PO SOLR: 4000.0000 mL | Freq: Once | ORAL | 0 refills | Status: AC

## 2024-05-10 NOTE — Telephone Encounter (Signed)
 Suprep not covered by insurance. Prep has been changed from [employer] to [employer].   Instructions provided in [place] message.  Message sent via mychart to make patient aware.  Thanks, Sorrel, CMA

## 2024-05-16 ENCOUNTER — Ambulatory Visit: Payer: Self-pay | Admitting: Anesthesiology

## 2024-05-16 ENCOUNTER — Encounter
Admission: RE | Disposition: A | Discharge status: Home / Self Care | Payer: Self-pay | Source: Home / Self Care | Attending: Gastroenterology

## 2024-05-16 ENCOUNTER — Ambulatory Visit
Admission: RE | Admit: 2024-05-16 | Discharge: 2024-05-16 | Disposition: A | Discharge status: Home / Self Care | Attending: Gastroenterology | Admitting: Gastroenterology

## 2024-05-16 DIAGNOSIS — Z79890 Hormone replacement therapy (postmenopausal): Secondary | ICD-10-CM | POA: Insufficient documentation

## 2024-05-16 DIAGNOSIS — K640 First degree hemorrhoids: Secondary | ICD-10-CM | POA: Insufficient documentation

## 2024-05-16 DIAGNOSIS — Z1211 Encounter for screening for malignant neoplasm of colon: Secondary | ICD-10-CM

## 2024-05-16 DIAGNOSIS — I10 Essential (primary) hypertension: Secondary | ICD-10-CM | POA: Insufficient documentation

## 2024-05-16 DIAGNOSIS — K219 Gastro-esophageal reflux disease without esophagitis: Secondary | ICD-10-CM | POA: Insufficient documentation

## 2024-05-16 DIAGNOSIS — E039 Hypothyroidism, unspecified: Secondary | ICD-10-CM | POA: Insufficient documentation

## 2024-05-16 DIAGNOSIS — Z79899 Other long term (current) drug therapy: Secondary | ICD-10-CM | POA: Insufficient documentation

## 2024-05-16 SURGERY — COLONOSCOPY
Anesthesia: General

## 2024-05-16 MED ORDER — EPHEDRINE SULFATE-NACL 50-0.9 MG/10ML-% IV SOSY
PREFILLED_SYRINGE | INTRAVENOUS | Status: DC | PRN
  Administered 2024-05-16: 10 mg via INTRAVENOUS

## 2024-05-16 MED ORDER — PROPOFOL 10 MG/ML IV BOLUS
INTRAVENOUS | Status: AC
  Filled 2024-05-16: qty 40

## 2024-05-16 MED ORDER — LIDOCAINE HCL (PF) 2 % IJ SOLN
INTRAMUSCULAR | Status: AC
  Filled 2024-05-16: qty 5

## 2024-05-16 MED ORDER — STERILE WATER FOR IRRIGATION IR SOLN
Status: DC | PRN
  Administered 2024-05-16: 1

## 2024-05-16 MED ORDER — PROPOFOL 500 MG/50ML IV EMUL
INTRAVENOUS | Status: DC | PRN
  Administered 2024-05-16: 60 mg via INTRAVENOUS
  Administered 2024-05-16: 125 ug/kg/min via INTRAVENOUS

## 2024-05-16 MED ORDER — LIDOCAINE HCL (CARDIAC) PF 100 MG/5ML IV SOSY
PREFILLED_SYRINGE | INTRAVENOUS | Status: DC | PRN
  Administered 2024-05-16: 50 mg via INTRAVENOUS

## 2024-05-16 MED ORDER — SODIUM CHLORIDE 0.9 % IV SOLN
INTRAVENOUS | Status: DC
  Administered 2024-05-16: 500 mL via INTRAVENOUS

## 2024-05-16 NOTE — H&P (Signed)
 Marnee Sink, MD [facility] [address], [address] [place], [place] [address] Phone: [phone] Fax : [phone]  Primary Care Physician:  Rockney Cid, DO Primary Gastroenterologist:  Dr. Ole Berkeley  Pre-Procedure History & Physical: HPI:  Michelle Baxter is a 63 y.o. female is here for a screening colonoscopy.   Past Medical History:  Diagnosis Date   Arthritis    shoulders   Dental bridge present    permanent top, removable lower   GERD (gastroesophageal reflux disease)    Hypertension    Hypothyroidism    PONV (postoperative nausea and vomiting)    after cone biopsy   Pre-diabetes     Past Surgical History:  Procedure Laterality Date   ABDOMINAL HYSTERECTOMY     CATARACT EXTRACTION W/PHACO Right 10/22/2022   Procedure: CATARACT EXTRACTION PHACO AND INTRAOCULAR LENS PLACEMENT (IOC) RIGHT  10:34  01:01.4;  Surgeon: Annell Kidney, MD;  Location: [facility];  Service: Ophthalmology;  Laterality: Right;   CATARACT EXTRACTION W/PHACO Left 11/05/2022   Procedure: CATARACT EXTRACTION PHACO AND INTRAOCULAR LENS PLACEMENT (IOC) LEFT 5.96 00:45.5;  Surgeon: Annell Kidney, MD;  Location: [facility];  Service: Ophthalmology;  Laterality: Left;   CERVICAL CONE BIOPSY      Prior to Admission medications   Medication Sig Start Date End Date Taking? Authorizing Provider  amLODipine  (NORVASC ) 5 MG tablet Take 1 tablet (5 mg total) by mouth daily. 01/21/24 01/20/25 Yes Rockney Cid, DO  cetirizine (ZYRTEC) 10 MG tablet Take 10 mg by mouth daily.   Yes [provider]  levothyroxine  (SYNTHROID ) 50 MCG tablet Take 1 tablet (50 mcg total) by mouth daily before breakfast. 01/25/24  Yes Rockney Cid, DO  losartan  (COZAAR ) 100 MG tablet Take 1 tablet (100 mg total) by mouth daily. 01/21/24  Yes Rockney Cid, DO  omeprazole  (PRILOSEC) 40 MG capsule Take 1 capsule (40 mg total) by mouth daily. 01/21/24  Yes Rockney Cid, DO   tiZANidine  (ZANAFLEX ) 4 MG tablet Take 1 tablet (4 mg total) by mouth every 6 (six) hours as needed for muscle spasms. 01/21/24  Yes Rockney Cid, DO    Allergies as of 04/11/2024   (No Known Allergies)    Family History  Problem Relation Age of Onset   Breast cancer Neg Hx     Social History   Socioeconomic History   Marital status: Married    Spouse name: Not on file   Number of children: 2   Years of education: Not on file   Highest education level: Not on file  Occupational History   Not on file  Tobacco Use   Smoking status: Never    Passive exposure: Never   Smokeless tobacco: Never  Vaping Use   Vaping status: Never Used  Substance and Sexual Activity   Alcohol use: Not Currently   Drug use: Never   Sexual activity: Not Currently  Other Topics Concern   Not on file  Social History Narrative   Not on file   Social Drivers of Health   Financial Resource Strain: Not on file  Food Insecurity: Not on file  Transportation Needs: Not on file  Physical Activity: Not on file  Stress: Not on file  Social Connections: Not on file  Intimate Partner Violence: Not on file    Review of Systems: See HPI, otherwise negative ROS  Physical Exam: BP 136/69   Pulse 75   Temp 98.3 F (36.8 C) (Temporal)   Resp 16   Ht 5\' 4"  (1.626  m)   Wt 59 kg   SpO2 100%   BMI 22.31 kg/m  General:   Alert,  pleasant and cooperative in NAD Head:  Normocephalic and atraumatic. Neck:  Supple; no masses or thyromegaly. Lungs:  Clear throughout to auscultation.    Heart:  Regular rate and rhythm. Abdomen:  Soft, nontender and nondistended. Normal bowel sounds, without guarding, and without rebound.   Neurologic:  Alert and  oriented x4;  grossly normal neurologically.  Impression/Plan: Michelle Baxter is now here to undergo a screening colonoscopy.  Risks, benefits, and alternatives regarding colonoscopy have been reviewed with the patient.  Questions have been  answered.  All parties agreeable.

## 2024-05-16 NOTE — Anesthesia Postprocedure Evaluation (Signed)
 Anesthesia Post Note  Patient: Michelle Baxter  Procedure(s) Performed: COLONOSCOPY  Patient location during evaluation: Endoscopy Anesthesia Type: General Level of consciousness: awake and alert Pain management: pain level controlled Vital Signs Assessment: post-procedure vital signs reviewed and stable Respiratory status: spontaneous breathing, nonlabored ventilation, respiratory function stable and patient connected to nasal cannula oxygen Cardiovascular status: blood pressure returned to baseline and stable Postop Assessment: no apparent nausea or vomiting Anesthetic complications: no  No notable events documented.   Last Vitals:  Vitals:   05/16/24 0914 05/16/24 0958  BP: 136/69 93/60  Pulse: 75 71  Resp: 16 19  Temp: 36.8 C 36.7 C  SpO2: 100% 99%    Last Pain:  Vitals:   05/16/24 0958  TempSrc: Tympanic  PainSc: Asleep                 Enrique Harvest

## 2024-05-16 NOTE — Anesthesia Preprocedure Evaluation (Signed)
 Anesthesia Evaluation  Patient identified by MRN, date of birth, ID band Patient awake    Reviewed: Allergy & Precautions, NPO status , Patient's Chart, lab work & pertinent test results  History of Anesthesia Complications (+) PONV and history of anesthetic complications  Airway Mallampati: II  TM Distance: >3 FB Neck ROM: full    Dental  (+) Partial Lower   Pulmonary neg pulmonary ROS, neg sleep apnea, neg COPD, Patient abstained from smoking.Not current smoker   Pulmonary exam normal breath sounds clear to auscultation       Cardiovascular Exercise Tolerance: Good hypertension, On Medications (-) CAD and (-) Past MI Normal cardiovascular exam(-) dysrhythmias  Rhythm:Regular Rate:Normal - Systolic murmurs    Neuro/Psych negative neurological ROS  negative psych ROS   GI/Hepatic Neg liver ROS,GERD  Medicated,,  Endo/Other  neg diabetesHypothyroidism    Renal/GU      Musculoskeletal   Abdominal   Peds  Hematology negative hematology ROS (+)   Anesthesia Other Findings Past Medical History: No date: Arthritis     Comment:  shoulders No date: Dental bridge present     Comment:  permanent top, removable lower No date: GERD (gastroesophageal reflux disease) No date: Hypertension No date: Hypothyroidism No date: PONV (postoperative nausea and vomiting)     Comment:  after cone biopsy No date: Pre-diabetes  Past Surgical History: No date: ABDOMINAL HYSTERECTOMY No date: CERVICAL CONE BIOPSY  BMI    Body Mass Index: 24.51 kg/m      Reproductive/Obstetrics negative OB ROS                             Anesthesia Physical Anesthesia Plan  ASA: 2  Anesthesia Plan: General   Post-op Pain Management: Minimal or no pain anticipated   Induction: Intravenous  PONV Risk Score and Plan: 3 and Propofol infusion, TIVA and Ondansetron   Airway Management Planned: Nasal  Cannula  Additional Equipment: None  Intra-op Plan:   Post-operative Plan:   Informed Consent: I have reviewed the patients History and Physical, chart, labs and discussed the procedure including the risks, benefits and alternatives for the proposed anesthesia with the patient or authorized representative who has indicated his/her understanding and acceptance.     Dental advisory given  Plan Discussed with: CRNA and Surgeon  Anesthesia Plan Comments: (Discussed risks of anesthesia with patient, including possibility of difficulty with spontaneous ventilation under anesthesia necessitating airway intervention, PONV, and rare risks such as cardiac or respiratory or neurological events, and allergic reactions. Discussed the role of CRNA in patient's perioperative care. Patient understands.)       Anesthesia Quick Evaluation

## 2024-05-16 NOTE — Op Note (Signed)
 [facility] Gastroenterology Patient Name: Michelle Baxter [facility] Procedure Date: 05/16/2024 9:23 AM MRN: [id number] Account #: [id number] Date of Birth: Sep 20, 1961 Admit Type: Outpatient Age: 63 Room: [facility] ENDO ROOM 4 Gender: Female Note Status: Finalized Instrument Name: Charlyn Cooley [id number] Procedure:             Colonoscopy Indications:           Screening for colorectal malignant neoplasm Providers:             Marnee Sink MD, MD Referring MD:          Marnee Sink MD, MD (Referring MD), Rockney Cid                         (Referring MD) Medicines:             Propofol per Anesthesia Complications:         No immediate complications. Procedure:             Pre-Anesthesia Assessment:                        - Prior to the procedure, a History and Physical was                         performed, and patient medications and allergies were                         reviewed. The patient's tolerance of previous                         anesthesia was also reviewed. The risks and benefits                         of the procedure and the sedation options and risks                         were discussed with the patient. All questions were                         answered, and informed consent was obtained. Prior                         Anticoagulants: The patient has taken no anticoagulant                         or antiplatelet agents. ASA Grade Assessment: II - A                         patient with mild systemic disease. After reviewing                         the risks and benefits, the patient was deemed in                         satisfactory condition to undergo the procedure.                        After obtaining informed consent, the colonoscope was  passed under direct vision. Throughout the procedure,                         the patient's blood pressure, pulse, and oxygen                         saturations were monitored continuously.  The                         Colonoscope was introduced through the anus and                         advanced to the the cecum, identified by appendiceal                         orifice and ileocecal valve. The colonoscopy was                         performed without difficulty. The patient tolerated                         the procedure well. The quality of the bowel                         preparation was excellent. Findings:      The perianal and digital rectal examinations were normal.      Non-bleeding internal hemorrhoids were found during retroflexion. The       hemorrhoids were Grade I (internal hemorrhoids that do not prolapse).      The exam was otherwise without abnormality. Impression:            - Non-bleeding internal hemorrhoids.                        - The examination was otherwise normal.                        - No specimens collected. Recommendation:        - Discharge patient to home.                        - Resume previous diet.                        - Continue present medications.                        - Repeat colonoscopy in 10 years for screening                         purposes. Procedure Code(s):     --- Professional ---                        [phone], Colonoscopy, flexible; diagnostic, including                         collection of specimen(s) by brushing or washing, when                         performed (separate procedure) Diagnosis Code(s):     ---  Professional ---                        Z12.11, Encounter for screening for malignant neoplasm                         of colon CPT copyright 2022 [employer]. All rights reserved. The codes documented in this report are preliminary and upon coder review may  be revised to meet current compliance requirements. Marnee Sink MD, MD 05/16/2024 9:54:00 AM This report has been signed electronically. Number of Addenda: 0 Note Initiated On: 05/16/2024 9:23 AM Scope Withdrawal Time: 0 hours 9 minutes  7 seconds  Total Procedure Duration: 0 hours 12 minutes 33 seconds  Estimated Blood Loss:  Estimated blood loss: none.      [facility]

## 2024-05-16 NOTE — Transfer of Care (Signed)
 Immediate Anesthesia Transfer of Care Note  Patient: Michelle Baxter [facility]  Procedure(s) Performed: COLONOSCOPY  Patient Location: PACU  Anesthesia Type:MAC  Level of Consciousness: drowsy  Airway & Oxygen Therapy: Patient Spontanous Breathing  Post-op Assessment: Report given to RN and Post -op Vital signs reviewed and stable  Post vital signs: stable  Last Vitals:  Vitals Value Taken Time  BP 93/60 05/16/24 0958  Temp 36.7 C 05/16/24 0958  Pulse 70 05/16/24 0958  Resp 18 05/16/24 0958  SpO2 99 % 05/16/24 0958  Vitals shown include unfiled device data.  Last Pain:  Vitals:   05/16/24 0958  TempSrc: Tympanic  PainSc: Asleep         Complications: No notable events documented.

## 2024-05-17 ENCOUNTER — Encounter: Payer: Self-pay | Admitting: Gastroenterology

## 2024-07-26 ENCOUNTER — Other Ambulatory Visit: Payer: Self-pay

## 2024-07-26 ENCOUNTER — Ambulatory Visit: Payer: Self-pay | Admitting: Internal Medicine

## 2024-07-26 ENCOUNTER — Encounter: Payer: Self-pay | Admitting: Internal Medicine

## 2024-07-26 VITALS — BP 124/72 | HR 77 | Temp 98.1°F | Resp 16 | Ht 62.0 in | Wt 132.0 lb

## 2024-07-26 DIAGNOSIS — E039 Hypothyroidism, unspecified: Secondary | ICD-10-CM | POA: Diagnosis not present

## 2024-07-26 DIAGNOSIS — J3489 Other specified disorders of nose and nasal sinuses: Secondary | ICD-10-CM

## 2024-07-26 DIAGNOSIS — I10 Essential (primary) hypertension: Secondary | ICD-10-CM

## 2024-07-26 DIAGNOSIS — R7303 Prediabetes: Secondary | ICD-10-CM | POA: Diagnosis not present

## 2024-07-26 DIAGNOSIS — K219 Gastro-esophageal reflux disease without esophagitis: Secondary | ICD-10-CM

## 2024-07-26 DIAGNOSIS — E782 Mixed hyperlipidemia: Secondary | ICD-10-CM

## 2024-07-26 LAB — POCT GLYCOSYLATED HEMOGLOBIN (HGB A1C): Hemoglobin A1C: 5.6 % (ref 4.0–5.6)

## 2024-07-26 MED ORDER — LEVOTHYROXINE SODIUM 50 MCG PO TABS: 50.0000 ug | ORAL_TABLET | Freq: Every day | ORAL | 1 refills | Status: AC

## 2024-07-26 MED ORDER — LOSARTAN POTASSIUM 100 MG PO TABS: 100.0000 mg | ORAL_TABLET | Freq: Every day | ORAL | 1 refills | Status: AC

## 2024-07-26 MED ORDER — AMLODIPINE BESYLATE 5 MG PO TABS: 5.0000 mg | ORAL_TABLET | Freq: Every day | ORAL | 1 refills | Status: AC

## 2024-07-26 MED ORDER — OMEPRAZOLE 40 MG PO CPDR: 40.0000 mg | DELAYED_RELEASE_CAPSULE | Freq: Every day | ORAL | 1 refills | Status: AC

## 2024-07-26 NOTE — Progress Notes (Signed)
 Established Patient Office Visit  Subjective    Patient ID: Michelle Baxter, female    DOB: 08/24/1961  Age: 63 y.o. MRN: [id number]  CC:  Chief Complaint  Patient presents with   Medical Management of Chronic Issues    6 month recheck    HPI Michelle Baxter presents to follow up on chronic medical conditions. She is here with an interpretor today to help with communication.  Discussed the use of AI scribe software for clinical note transcription with the patient, who gave verbal consent to proceed.  History of Present Illness Michelle Baxter is a 63 year old female who presents for a six-month follow-up visit.  She experiences difficulty swallowing saliva in the mornings due to a sensation of phlegm in her throat, described as a 'little bowl of mucus,' persisting for more than six months. She uses Zyrtec and a nasal spray for relief, which sometimes helps to clear the phlegm.  She takes amlodipine  and losartan  for blood pressure management, with a recent reading of 124/72 mmHg. Missing a dose results in elevated blood pressure and headaches. Her weight remains stable between 130 and 134 pounds.  Thyroid function tests in February were normal. She has dry skin on her legs when not using lotion but denies fatigue, depression, weight gain, constipation, or other thyroid-related symptoms.  Previous labs indicated high cholesterol with an LDL level of 149 mg/dL. Her A1c is 5.6, showing improvement from a previous 5.9.  She has a mammogram scheduled for next month and had a normal colonoscopy earlier this year.   Hypertension: -Medications: Amlodipine  5 mg, Losartan  100 mg has been stable on this regimen for years -Patient is compliant with above medications and reports no side effects. -Checking BP at home (average): Only checks when she has symptoms of hypertension like headaches -Denies any SOB, CP, vision changes, LE edema or symptoms of  hypotension  HLD: -Medications: Nothing -Last lipid panel: Lipid Panel     Component Value Date/Time   CHOL 236 (H) 01/22/2024 0806   TRIG 63 01/22/2024 0806   HDL 71 01/22/2024 0806   CHOLHDL 3.3 01/22/2024 0806   LDLCALC 149 (H) 01/22/2024 0806   The 10-year ASCVD risk score (Arnett DK, et al., 2019) is: 5.5%   Values used to calculate the score:     Age: 15 years     Clincally relevant sex: Female     Is Non-Hispanic African American: No     Diabetic: No     Tobacco smoker: No     Systolic Blood Pressure: 124 mmHg     Is BP treated: Yes     HDL Cholesterol: 71 mg/dL     Total Cholesterol: 236 mg/dL  Hypothyroidism: -Medications: Levothyroxine  50 mcg -Patient is compliant with the above medication (s) at the above dose and reports no medication side effects.  -Denies weight changes, cold./heat intolerance, skin changes, anxiety/palpitations  -Last TSH: 2.96 2/25 -Thyroid US  2016 normal   Pre-Diabetes: -Last A1c 5.9% 2/25 -Not on medications  GERD: -Currently on Prilosec 40 mg, as needed, usually takes about 2 or 3 times a week  Environmental Allergies: -Currently on Zyrtec 10 mg seasonally -Having some sinus drainage in the morning  Arthritis:  -In neck and shoulders -Will take Tylenol which helps a little bit but does not remove pain  -Has not noticed pain, erythema and swelling over the knuckle on the second digit on her left hand  Health Maintenance: -Blood work UTD -Mammogram due,  scheduled 08/30/24 -Pap - history of complete hysterectomy - had it 8/23 negative  -Colon cancer screening: colonoscopy 6/25, repeat in 10 years  Outpatient Encounter Medications as of 07/26/2024  Medication Sig   amLODipine  (NORVASC ) 5 MG tablet Take 1 tablet (5 mg total) by mouth daily.   cetirizine (ZYRTEC) 10 MG tablet Take 10 mg by mouth daily.   levothyroxine  (SYNTHROID ) 50 MCG tablet Take 1 tablet (50 mcg total) by mouth daily before breakfast.   losartan  (COZAAR ) 100 MG  tablet Take 1 tablet (100 mg total) by mouth daily.   omeprazole  (PRILOSEC) 40 MG capsule Take 1 capsule (40 mg total) by mouth daily.   tiZANidine  (ZANAFLEX ) 4 MG tablet Take 1 tablet (4 mg total) by mouth every 6 (six) hours as needed for muscle spasms.   No facility-administered encounter medications on file as of 07/26/2024.    Past Medical History:  Diagnosis Date   Arthritis    shoulders   Dental bridge present    permanent top, removable lower   GERD (gastroesophageal reflux disease)    Hypertension    Hypothyroidism    PONV (postoperative nausea and vomiting)    after cone biopsy   Pre-diabetes     Past Surgical History:  Procedure Laterality Date   ABDOMINAL HYSTERECTOMY     CATARACT EXTRACTION W/PHACO Right 10/22/2022   Procedure: CATARACT EXTRACTION PHACO AND INTRAOCULAR LENS PLACEMENT (IOC) RIGHT  10:34  01:01.4;  Surgeon: Mittie Gaskin, MD;  Location: [facility];  Service: Ophthalmology;  Laterality: Right;   CATARACT EXTRACTION W/PHACO Left 11/05/2022   Procedure: CATARACT EXTRACTION PHACO AND INTRAOCULAR LENS PLACEMENT (IOC) LEFT 5.96 00:45.5;  Surgeon: Mittie Gaskin, MD;  Location: [facility];  Service: Ophthalmology;  Laterality: Left;   CERVICAL CONE BIOPSY     COLONOSCOPY N/A 05/16/2024   Procedure: COLONOSCOPY;  Surgeon: Jinny Carmine, MD;  Location: [facility] ENDOSCOPY;  Service: Endoscopy;  Laterality: N/A;  SPANISH INTERPRETER    Family History  Problem Relation Age of Onset   Breast cancer Neg Hx     Social History   Socioeconomic History   Marital status: Married    Spouse name: Not on file   Number of children: 2   Years of education: Not on file   Highest education level: Not on file  Occupational History   Not on file  Tobacco Use   Smoking status: Never    Passive exposure: Never   Smokeless tobacco: Never  Vaping Use   Vaping status: Never Used  Substance and Sexual Activity   Alcohol use: Not Currently    Drug use: Never   Sexual activity: Not Currently  Other Topics Concern   Not on file  Social History Narrative   Not on file   Social Drivers of Health   Financial Resource Strain: Not on file  Food Insecurity: Not on file  Transportation Needs: Not on file  Physical Activity: Not on file  Stress: Not on file  Social Connections: Not on file  Intimate Partner Violence: Not on file    Review of Systems  Constitutional:  Negative for chills and fever.  HENT:  Negative for congestion, sinus pain and sore throat.   Eyes:  Negative for blurred vision.  Respiratory:  Negative for shortness of breath.   Cardiovascular:  Negative for chest pain.        Objective    BP 124/72 (Cuff Size: Large)   Pulse 77   Temp 98.1 F (36.7 C) (Oral)  Resp 16   Ht 5' 2 (1.575 m)   Wt 132 lb (59.9 kg)   SpO2 99%   BMI 24.14 kg/m   Physical Exam Constitutional:      Appearance: Normal appearance.  HENT:     Head: Normocephalic and atraumatic.  Eyes:     Conjunctiva/sclera: Conjunctivae normal.  Cardiovascular:     Rate and Rhythm: Normal rate and regular rhythm.  Pulmonary:     Effort: Pulmonary effort is normal.     Breath sounds: Normal breath sounds.  Skin:    General: Skin is warm and dry.  Neurological:     General: No focal deficit present.     Mental Status: She is alert. Mental status is at baseline.  Psychiatric:        Mood and Affect: Mood normal.        Behavior: Behavior normal.         Assessment & Plan:   Assessment & Plan Allergic rhinitis with chronic postnasal drip Chronic postnasal drip likely due to sinus drainage, exacerbated in the morning from lying flat overnight. Currently using Zyrtec for allergies. - Recommend nasal saline rinse with distilled water , twice daily, morning and night. - If symptoms persist, use Flonase nasal spray, two sprays each side twice daily as needed.  Essential hypertension Blood pressure is well-controlled on  current medication regimen with a recent reading of 124/72 mmHg. Blood pressure increases when medication is missed, causing symptoms like headaches. - Refill amlodipine  and losartan  prescriptions. - Advise consistent use of blood pressure medications to prevent elevated blood pressure and associated symptoms like headaches.  Dyslipidemia LDL cholesterol is elevated at 149 mg/dL, above the target of less than 90 mg/dL. Current 10-year risk of cardiovascular event is 5.5%, considered low borderline. No immediate need for medication, focus on dietary modifications. - Advise dietary modifications to reduce cholesterol, including prioritizing lean proteins like chicken, [place], and fish, and reducing intake of red meats and processed foods. - Plan to recheck cholesterol levels annually.  Hypothyroidism Thyroid function tests were normal in February. No significant symptoms of hypothyroidism reported, though dry skin noted, which may be unrelated. - Plan to recheck thyroid function tests in six months. - Advise to report any significant changes in symptoms, such as severe dry skin or other hypothyroid symptoms, for earlier evaluation.  Hx of Pre-Diabetes - A1c today within the normal range at 5.6%. Will continue to monitor with yearly labs.   GERD - Stable, continue PPI as needed.    - amLODipine  (NORVASC ) 5 MG tablet; Take 1 tablet (5 mg total) by mouth daily.  Dispense: 90 tablet; Refill: 1 - losartan  (COZAAR ) 100 MG tablet; Take 1 tablet (100 mg total) by mouth daily.  Dispense: 90 tablet; Refill: 1 - POCT HgB A1C - levothyroxine  (SYNTHROID ) 50 MCG tablet; Take 1 tablet (50 mcg total) by mouth daily before breakfast.  Dispense: 90 tablet; Refill: 1 - omeprazole  (PRILOSEC) 40 MG capsule; Take 1 capsule (40 mg total) by mouth daily.  Dispense: 90 capsule; Refill: 1  Return in about 6 months (around 01/26/2025).   Sharyle Fischer, DO

## 2024-08-30 ENCOUNTER — Ambulatory Visit

## 2024-09-28 ENCOUNTER — Ambulatory Visit
Admission: RE | Admit: 2024-09-28 | Discharge: 2024-09-28 | Disposition: A | Discharge status: Home / Self Care | Source: Ambulatory Visit | Attending: Internal Medicine | Admitting: Internal Medicine

## 2024-09-28 DIAGNOSIS — Z1231 Encounter for screening mammogram for malignant neoplasm of breast: Secondary | ICD-10-CM | POA: Insufficient documentation

## 2024-10-03 ENCOUNTER — Ambulatory Visit: Payer: Self-pay | Admitting: Internal Medicine

## 2025-01-26 ENCOUNTER — Ambulatory Visit: Admitting: Internal Medicine
